# Patient Record
Sex: Female | Born: 1995 | Hispanic: No | Marital: Single | Smoking: Former smoker
Health system: Southern US, Community
[De-identification: ages and names within clinical notes are randomized; demographics above are authoritative.]

## PROBLEM LIST (undated history)

## (undated) ENCOUNTER — Inpatient Hospital Stay (HOSPITAL_COMMUNITY): Payer: Self-pay

## (undated) DIAGNOSIS — L309 Dermatitis, unspecified: Secondary | ICD-10-CM

## (undated) DIAGNOSIS — J45909 Unspecified asthma, uncomplicated: Secondary | ICD-10-CM

## (undated) DIAGNOSIS — F329 Major depressive disorder, single episode, unspecified: Secondary | ICD-10-CM

## (undated) DIAGNOSIS — F419 Anxiety disorder, unspecified: Secondary | ICD-10-CM

## (undated) DIAGNOSIS — J40 Bronchitis, not specified as acute or chronic: Secondary | ICD-10-CM

## (undated) DIAGNOSIS — F32A Depression, unspecified: Secondary | ICD-10-CM

## (undated) HISTORY — PX: WISDOM TOOTH EXTRACTION: SHX21

---
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
## 2017-03-07 ENCOUNTER — Ambulatory Visit (INDEPENDENT_AMBULATORY_CARE_PROVIDER_SITE_OTHER): Payer: Medicaid Other | Admitting: Physician Assistant

## 2017-03-07 ENCOUNTER — Encounter: Payer: Self-pay | Admitting: Physician Assistant

## 2017-03-07 VITALS — BP 112/79 | HR 116 | Temp 99.1°F | Wt 170.0 lb

## 2017-03-07 DIAGNOSIS — L2084 Intrinsic (allergic) eczema: Secondary | ICD-10-CM | POA: Diagnosis not present

## 2017-03-07 DIAGNOSIS — F418 Other specified anxiety disorders: Secondary | ICD-10-CM | POA: Insufficient documentation

## 2017-03-07 DIAGNOSIS — F39 Unspecified mood [affective] disorder: Secondary | ICD-10-CM

## 2017-03-07 DIAGNOSIS — K219 Gastro-esophageal reflux disease without esophagitis: Secondary | ICD-10-CM | POA: Diagnosis not present

## 2017-03-07 MED ORDER — RISPERIDONE 0.5 MG PO TABS: 0.5000 mg | ORAL_TABLET | Freq: Every day | ORAL | 1 refills | Status: DC

## 2017-03-07 MED ORDER — CITALOPRAM HYDROBROMIDE 20 MG PO TABS: 20.0000 mg | ORAL_TABLET | Freq: Every day | ORAL | 1 refills | Status: DC

## 2017-03-07 MED ORDER — OMEPRAZOLE 20 MG PO TBEC: 20.0000 mg | DELAYED_RELEASE_TABLET | Freq: Every day | ORAL | 2 refills | Status: DC

## 2017-03-07 MED ORDER — TRIAMCINOLONE ACETONIDE 0.1 % EX CREA: 1.0000 "application " | TOPICAL_CREAM | Freq: Two times a day (BID) | CUTANEOUS | 2 refills | Status: DC

## 2017-03-07 NOTE — Patient Instructions (Signed)
Bipolar 2 Disorder Bipolar 2 disorder is a mental health disorder in which a person has episodes of emotional highs (mania) and lows (depression). Bipolar 2 is different from other bipolar disorders because the manic episodes are not as high and do not last as long. This is called hypomania. People with bipolar 2 disorder usually go back and forth between hypomanic and depressive episodes. What are the causes? The cause of this condition is not known. What increases the risk? The following factors may make you more likely to develop this condition:  Having a family member with the disorder.  An imbalance of certain chemicals in the brain (neurotransmitters).  Stress, such as a death, illness, or financial problems.  Certain conditions that affect the brain or spinal cord (neurologic conditions).  Brain injury (trauma).  Having another mental health disorder, such as: ? Obsessive compulsive disorder. ? Schizophrenia.  What are the signs or symptoms? Symptoms of hypomania include:  Very high self-esteem or self-confidence.  Decreased need for sleep.  Unusual talkativeness or feeling a need to keep talking. Speech may be very fast. It may seem like you cannot stop talking.  Racing thoughts or constant talking, with quick shifts between topics that may or may not be related (flight of ideas).  Decreased ability to focus or concentrate.  Increased purposeful activity, such as work, studies, or social activity.  Increased nonproductive activity. This could be pacing, squirming and fidgeting, or finger and toe tapping.  Impulsive behavior and poor judgment. This may result in high-risk activities, such as having unprotected sex or spending a lot of money.  Symptoms of depression include:  Feeling sad, hopeless, or helpless.  Frequent or uncontrollable crying.  Lack of feeling or caring about anything.  Sleeping too much.  Moving more slowly than usual.  Not being able to  enjoy things you used to enjoy.  Desire to be alone all the time.  Feeling guilty or worthless.  Lack of energy or motivation.  Trouble concentrating or remembering.  Trouble making decisions.  Increased appetite.  Thoughts of death or desire to harm yourself.  How is this diagnosed? To diagnose bipolar 2 disorder, your health care provider may ask about your:  Emotional episodes.  Medical history.  Alcohol and drug use. This includes prescription medicines. Certain medical conditions and substances can cause symptoms that seem like bipolar disorder (secondary bipolar disorder).  How is this treated? Bipolar 2 disorder is a long-term (chronic) illness. It is best controlled with ongoing (continuous) treatment rather than being treated only when symptoms occur. Treatment may include:  Psychotherapy. Some forms of talk therapy, such as cognitive-behavioral therapy (CBT), can provide support, education, and guidance.  Coping strategies, such as journaling or relaxation exercises. Relaxation exercises include: ? Yoga. ? Meditation. ? Deep breathing.  Lifestyle changes, such as: ? Limiting alcohol and drug use. ? Exercising regularly. ? Getting plenty of sleep. ? Making healthy eating choices.  Medicine. Medicine can be prescribed by a health care provider who specializes in treating mental disorders (psychiatrist). ? Medicines called mood stabilizers are usually prescribed. ? If symptoms occur even while taking a mood stabilizer, other medicines may be added.  A combination of medicine, talk therapy, and coping methods is the best way to treat this condition. Follow these instructions at home: Activity  Return to your normal activities as told by your health care provider.  Find activities that you enjoy, and make time to do them.  Exercise regularly as told by your health   care provider. Lifestyle  Limit alcohol intake to no more than 1 drink a day for nonpregnant  women and 2 drinks a day for men. One drink equals 12 oz of beer, 5 oz of wine, or 1 oz of hard liquor.  Follow a set schedule for eating and sleeping.  Eat a balanced diet that includes fresh fruits and vegetables, whole grains, low-fat dairy, and lean meats.  Get at least 7-8 hours of sleep each night. General instructions  Take over-the-counter and prescription medicines only as told by your health care provider.  Think about joining a support group. Your health care provider may be able to recommend a support group.  Talk with your family and loved ones about your treatment goals and how they can help.  Keep all follow-up visits as told by your health care provider. This is important. Where to find more information: For more information about bipolar 2 disorder, visit the following websites:  National Alliance on Mental Illness: www.nami.org  U.S. National Institute of Mental Health: www.nimh.nih.gov  Contact a health care provider if:  Your symptoms get worse.  You have side effects from your medicine, and they get worse.  You have trouble sleeping.  You have trouble doing daily activities.  You feel unsafe in your surroundings.  You are dealing with substance abuse. Get help right away if:  You have new symptoms.  You have thoughts about harming yourself or others.  You harm yourself. Summary  Bipolar 2 disorder is a mental health disorder in which a person has episodes of hypomania and depression.  Bipolar 2 is best treated through a combination of medicines, talk therapy, and coping strategies.  Talk with your family and loved ones about your treatment goals and how they can help. This information is not intended to replace advice given to you by your health care provider. Make sure you discuss any questions you have with your health care provider. Document Released: 08/31/2016 Document Revised: 08/31/2016 Document Reviewed: 08/31/2016 Elsevier Interactive  Patient Education  2018 Elsevier Inc.  

## 2017-03-07 NOTE — Progress Notes (Signed)
BP 112/79   Pulse (!) 116   Temp 99.1 F (37.3 C) (Oral)   Wt 170 lb (77.1 kg)   LMP 01/28/2017    Subjective:    Patient ID: Tiffany [name], female    DOB: [birth year], 21 y.o.   MRN: [id number]  HPI: Tiffany [name] is a 21 y.o. female presenting on 03/07/2017 for Nausea; Emesis; Heartburn; and Anxiety  This is a new patient comes in to be established today. She was [facility] by me. She has not been seen in some time. She reports that she had been to [facility] but several than it made her sick. She discontinued services there. She reports severe GERD reflux symptoms. She is also having significant depression and mood swings. She had been diagnosed with bipolar disorder. She has also had long-term problems with GERD and eczema. Depression screen PHQ 2/9 03/07/2017  Decreased Interest 3  Down, Depressed, Hopeless 3  PHQ - 2 Score 6  Altered sleeping 3  Tired, decreased energy 3  Change in appetite 0  Feeling bad or failure about yourself  3  Trouble concentrating 1  Moving slowly or fidgety/restless 3  Suicidal thoughts 0  PHQ-9 Score 19    Relevant past medical, surgical, family and social history reviewed and updated as indicated. Allergies and medications reviewed and updated.  Past Medical History:  Diagnosis Date  . Anxiety   . Asthma   . Bronchitis in child   . Depression   . Eczema     Past Surgical History:  Procedure Laterality Date  . WISDOM TOOTH EXTRACTION      Review of Systems  Constitutional: Negative.   HENT: Negative.   Eyes: Negative.   Respiratory: Negative.   Gastrointestinal: Positive for abdominal pain and nausea.  Genitourinary: Negative.   Skin: Positive for rash.  Psychiatric/Behavioral: Positive for agitation, decreased concentration, dysphoric mood and sleep disturbance. The patient is nervous/anxious.     Allergies as of 03/07/2017   No Known Allergies     Medication  List       Accurate as of 03/07/17 11:59 PM. Always use your most recent med list.          albuterol 108 (90 Base) MCG/ACT inhaler Commonly known as:  PROVENTIL HFA;VENTOLIN HFA Inhale 2 puffs into the lungs every 4 (four) hours as needed.   citalopram 20 MG tablet Commonly known as:  CELEXA Take 1 tablet (20 mg total) by mouth daily.   hydrOXYzine 25 MG tablet Commonly known as:  ATARAX/VISTARIL Take 1 tablet (25 mg total) by mouth every 6 (six) hours as needed for itching.   Omeprazole 20 MG Tbec Take 1 tablet (20 mg total) by mouth daily.   risperiDONE 0.5 MG tablet Commonly known as:  RISPERDAL Take 1 tablet (0.5 mg total) by mouth at bedtime.   triamcinolone ointment 0.1 % Commonly known as:  KENALOG Apply to soaking wet skin on legs BID   triamcinolone cream 0.1 % Commonly known as:  KENALOG Apply 1 application topically 2 (two) times daily.          Objective:    BP 112/79   Pulse (!) 116   Temp 99.1 F (37.3 C) (Oral)   Wt 170 lb (77.1 kg)   LMP 01/28/2017   No Known Allergies  Physical Exam  Constitutional: She is oriented to person, place, and time. She appears well-developed and well-nourished.  HENT:  Head: Normocephalic and atraumatic.  Right Ear: Tympanic membrane, external ear and ear canal normal.  Left Ear: Tympanic membrane, external ear and ear canal normal.  Nose: Nose normal. No rhinorrhea.  Mouth/Throat: Oropharynx is clear and moist and mucous membranes are normal. No oropharyngeal exudate or posterior oropharyngeal erythema.  Eyes: Pupils are equal, round, and reactive to light. Conjunctivae and EOM are normal.  Neck: Normal range of motion. Neck supple.  Cardiovascular: Normal rate, regular rhythm, normal heart sounds and intact distal pulses.   Pulmonary/Chest: Effort normal and breath sounds normal.  Abdominal: Soft. Bowel sounds are normal.  Neurological: She is alert and oriented to person, place, and time. She has normal  reflexes.  Skin: Skin is warm and dry. No rash noted.  Throughout trunk arms and upper legs flat erythematous patches with irritation  Psychiatric: She has a normal mood and affect. Her behavior is normal. Judgment and thought content normal.    Results for orders placed or performed during the hospital encounter of 10/11/16  Rapid strep screen  Result Value Ref Range   Streptococcus, Group A Screen (Direct) NEGATIVE NEGATIVE  Culture, group A strep  Result Value Ref Range   Specimen Description THROAT    Special Requests NONE    Culture      NO GROUP A STREP (S.PYOGENES) ISOLATED Performed at [facility], [address] [place], [place] [address]    Report Status 10/13/2016 FINAL       Assessment & Plan:   1. Gastroesophageal reflux disease without esophagitis - Omeprazole 20 MG TBEC; Take 1 tablet (20 mg total) by mouth daily.  Dispense: 30 each; Refill: 2  2. Mood disorder (HCC) - citalopram (CELEXA) 20 MG tablet; Take 1 tablet (20 mg total) by mouth daily.  Dispense: 30 tablet; Refill: 1 - risperiDONE (RISPERDAL) 0.5 MG tablet; Take 1 tablet (0.5 mg total) by mouth at bedtime.  Dispense: 30 tablet; Refill: 1  3. Intrinsic eczema - triamcinolone cream (KENALOG) 0.1 %; Apply 1 application topically 2 (two) times daily.  Dispense: 454 g; Refill: 2   Current Outpatient Prescriptions:  .  albuterol (PROVENTIL HFA;VENTOLIN HFA) 108 (90 Base) MCG/ACT inhaler, Inhale 2 puffs into the lungs every 4 (four) hours as needed., Disp: 6.7 g, Rfl: 0 .  hydrOXYzine (ATARAX/VISTARIL) 25 MG tablet, Take 1 tablet (25 mg total) by mouth every 6 (six) hours as needed for itching., Disp: 30 tablet, Rfl: 0 .  triamcinolone ointment (KENALOG) 0.1 %, Apply to soaking wet skin on legs BID, Disp: , Rfl:  .  citalopram (CELEXA) 20 MG tablet, Take 1 tablet (20 mg total) by mouth daily., Disp: 30 tablet, Rfl: 1 .  Omeprazole 20 MG TBEC, Take 1 tablet (20 mg total) by mouth daily., Disp: 30 each,  Rfl: 2 .  risperiDONE (RISPERDAL) 0.5 MG tablet, Take 1 tablet (0.5 mg total) by mouth at bedtime., Disp: 30 tablet, Rfl: 1 .  triamcinolone cream (KENALOG) 0.1 %, Apply 1 application topically 2 (two) times daily., Disp: 454 g, Rfl: 2   Continue all other maintenance medications as listed above.  Follow up plan: Return in about 4 weeks (around 04/04/2017) for recheck.  Educational handout given for survey  Remus LofflerAngel S. Tarun Patchell PA-C [facility] [address]  [place], [place] [address] [phone]   03/08/2017, 9:16 AM

## 2017-03-14 ENCOUNTER — Encounter (HOSPITAL_COMMUNITY): Payer: Self-pay | Admitting: *Deleted

## 2017-03-14 ENCOUNTER — Emergency Department (HOSPITAL_COMMUNITY)
Admission: EM | Admit: 2017-03-14 | Discharge: 2017-03-15 | Disposition: A | Discharge status: Home / Self Care | Payer: Medicaid Other | Attending: Emergency Medicine | Admitting: Emergency Medicine

## 2017-03-14 DIAGNOSIS — Z87891 Personal history of nicotine dependence: Secondary | ICD-10-CM | POA: Diagnosis not present

## 2017-03-14 DIAGNOSIS — O219 Vomiting of pregnancy, unspecified: Secondary | ICD-10-CM | POA: Insufficient documentation

## 2017-03-14 DIAGNOSIS — J45909 Unspecified asthma, uncomplicated: Secondary | ICD-10-CM | POA: Insufficient documentation

## 2017-03-14 DIAGNOSIS — Z3A01 Less than 8 weeks gestation of pregnancy: Secondary | ICD-10-CM | POA: Diagnosis not present

## 2017-03-14 DIAGNOSIS — Z79899 Other long term (current) drug therapy: Secondary | ICD-10-CM | POA: Diagnosis not present

## 2017-03-14 DIAGNOSIS — R112 Nausea with vomiting, unspecified: Secondary | ICD-10-CM | POA: Diagnosis present

## 2017-03-14 LAB — POC URINE PREG, ED: Preg Test, Ur: POSITIVE — AB

## 2017-03-14 NOTE — ED Triage Notes (Signed)
Pt c/o missed period for 11 days; pt states she took a home pregnancy test and it was positive

## 2017-03-15 MED ORDER — METOCLOPRAMIDE HCL 10 MG PO TABS: 10.0000 mg | ORAL_TABLET | Freq: Four times a day (QID) | ORAL | 0 refills | Status: DC | PRN

## 2017-03-15 MED ORDER — PRENATAL COMPLETE 14-0.4 MG PO TABS: 1.0000 | ORAL_TABLET | Freq: Every day | ORAL | 6 refills | Status: DC

## 2017-03-15 NOTE — ED Provider Notes (Signed)
[facility] DEPT Provider Note   CSN: [id number] Arrival date & time: 03/14/17  2331     History   Chief Complaint Chief Complaint  Patient presents with  . Possible Pregnancy    HPI Tiffany [name] is a 21 y.o. female.  Patient presents to the ER with concerns over pregnancy. Patient reports that she is 11 days overdue for her menstrual period. She has been experiencing nausea and vomiting in the mornings. She has noticed occasional very slight pain around the umbilicus when she lies down at night, but no continuous pain. No vaginal bleeding. She is concerned because she has had 2 previous miscarriages.      Past Medical History:  Diagnosis Date  . Anxiety   . Asthma   . Bronchitis in child   . Depression   . Eczema     Patient Active Problem List   Diagnosis Date Noted  . Gastroesophageal reflux disease without esophagitis 03/07/2017  . Mood disorder (HCC) 03/07/2017  . Intrinsic eczema 03/07/2017    Past Surgical History:  Procedure Laterality Date  . WISDOM TOOTH EXTRACTION      OB History    No data available       Home Medications    Prior to Admission medications   Medication Sig Start Date End Date Taking? Authorizing Provider  albuterol (PROVENTIL HFA;VENTOLIN HFA) 108 (90 Base) MCG/ACT inhaler Inhale 2 puffs into the lungs every 4 (four) hours as needed. 10/11/16   Devoria AlbeKnapp, Iva, MD  citalopram (CELEXA) 20 MG tablet Take 1 tablet (20 mg total) by mouth daily. 03/07/17   Remus LofflerJones, Angel S, PA-C  hydrOXYzine (ATARAX/VISTARIL) 25 MG tablet Take 1 tablet (25 mg total) by mouth every 6 (six) hours as needed for itching. 05/21/12   Marcellina MillinGaley, Timothy, MD  metoCLOPramide (REGLAN) 10 MG tablet Take 1 tablet (10 mg total) by mouth every 6 (six) hours as needed for nausea (nausea/headache). 03/15/17   Gilda CreasePollina, Christopher J, MD  Omeprazole 20 MG TBEC Take 1 tablet (20 mg total) by mouth daily. 03/07/17   Remus LofflerJones, Angel S, PA-C  Prenatal Vit-Fe Fumarate-FA (PRENATAL  COMPLETE) 14-0.4 MG TABS Take 1 tablet by mouth daily. 03/15/17   Gilda CreasePollina, Christopher J, MD  risperiDONE (RISPERDAL) 0.5 MG tablet Take 1 tablet (0.5 mg total) by mouth at bedtime. 03/07/17   Remus LofflerJones, Angel S, PA-C  triamcinolone cream (KENALOG) 0.1 % Apply 1 application topically 2 (two) times daily. 03/07/17   Remus LofflerJones, Angel S, PA-C  triamcinolone ointment (KENALOG) 0.1 % Apply to soaking wet skin on legs BID 12/12/12   [provider]    Family History Family History  Problem Relation Age of Onset  . COPD Mother     Social History Social History  Substance Use Topics  . Smoking status: Former Games developermoker  . Smokeless tobacco: Never Used  . Alcohol use No     Allergies   Patient has no known allergies.   Review of Systems Review of Systems  Gastrointestinal: Positive for nausea and vomiting.  All other systems reviewed and are negative.    Physical Exam Updated Vital Signs BP 118/77 (BP Location: Right Arm)   Pulse (!) 129   Temp 98.4 F (36.9 C) (Oral)   Resp 18   Ht 5' 2.5" (1.588 m)   Wt 64.6 kg (142 lb 8 oz)   LMP 02/27/2017   SpO2 98%   BMI 25.65 kg/m   Physical Exam  Constitutional: She is oriented to person, place, and time.  She appears well-developed and well-nourished. No distress.  HENT:  Head: Normocephalic and atraumatic.  Right Ear: Hearing normal.  Left Ear: Hearing normal.  Nose: Nose normal.  Mouth/Throat: Oropharynx is clear and moist and mucous membranes are normal.  Eyes: Pupils are equal, round, and reactive to light. Conjunctivae and EOM are normal.  Neck: Normal range of motion. Neck supple.  Cardiovascular: Regular rhythm, S1 normal and S2 normal.  Exam reveals no gallop and no friction rub.   No murmur heard. Pulmonary/Chest: Effort normal and breath sounds normal. No respiratory distress. She exhibits no tenderness.  Abdominal: Soft. Normal appearance and bowel sounds are normal. There is no hepatosplenomegaly. There is no tenderness.  There is no rebound, no guarding, no tenderness at McBurney's point and negative Murphy's sign. No hernia.  Musculoskeletal: Normal range of motion.  Neurological: She is alert and oriented to person, place, and time. She has normal strength. No cranial nerve deficit or sensory deficit. Coordination normal. GCS eye subscore is 4. GCS verbal subscore is 5. GCS motor subscore is 6.  Skin: Skin is warm, dry and intact. No rash noted. No cyanosis.  Psychiatric: She has a normal mood and affect. Her speech is normal and behavior is normal. Thought content normal.  Nursing note and vitals reviewed.    ED Treatments / Results  Labs (all labs ordered are listed, but only abnormal results are displayed) Labs Reviewed  POC URINE PREG, ED - Abnormal; Notable for the following:       Result Value   Preg Test, Ur POSITIVE (*)    All other components within normal limits    EKG  EKG Interpretation None       Radiology No results found.  Procedures Procedures (including critical care time)  Medications Ordered in ED Medications - No data to display   Initial Impression / Assessment and Plan / ED Course  I have reviewed the triage vital signs and the nursing notes.  Pertinent labs & imaging results that were available during my care of the patient were reviewed by me and considered in my medical decision making (see chart for details).     Patient presents with concern over pregnancy. She is nervous because she has had 2 previous miscarriages. She is experiencing morning sickness, but no other symptoms. There is no abdominal or pelvic pain. Her abdominal exam is benign. She has not had any vaginal bleeding. She does not require any further care at this time, will treat with Reglan for nausea and vomiting, [facility] at [facility].  Final Clinical Impressions(s) / ED Diagnoses   Final diagnoses:  Less than [redacted] weeks gestation of pregnancy    New Prescriptions New  Prescriptions   METOCLOPRAMIDE (REGLAN) 10 MG TABLET    Take 1 tablet (10 mg total) by mouth every 6 (six) hours as needed for nausea (nausea/headache).   PRENATAL VIT-FE FUMARATE-FA (PRENATAL COMPLETE) 14-0.4 MG TABS    Take 1 tablet by mouth daily.     Gilda Crease, MD 03/15/17 [id number]

## 2017-03-28 ENCOUNTER — Other Ambulatory Visit: Payer: Self-pay | Admitting: Physician Assistant

## 2017-03-28 ENCOUNTER — Encounter: Payer: Self-pay | Admitting: Adult Health

## 2017-03-28 ENCOUNTER — Telehealth: Payer: Self-pay | Admitting: Adult Health

## 2017-03-28 ENCOUNTER — Ambulatory Visit (INDEPENDENT_AMBULATORY_CARE_PROVIDER_SITE_OTHER): Payer: Medicaid Other | Admitting: Adult Health

## 2017-03-28 VITALS — BP 110/60 | HR 88 | Ht 62.5 in | Wt 164.0 lb

## 2017-03-28 DIAGNOSIS — F329 Major depressive disorder, single episode, unspecified: Secondary | ICD-10-CM

## 2017-03-28 DIAGNOSIS — N926 Irregular menstruation, unspecified: Secondary | ICD-10-CM | POA: Diagnosis not present

## 2017-03-28 DIAGNOSIS — F32A Depression, unspecified: Secondary | ICD-10-CM

## 2017-03-28 DIAGNOSIS — Z3201 Encounter for pregnancy test, result positive: Secondary | ICD-10-CM | POA: Diagnosis not present

## 2017-03-28 DIAGNOSIS — O3680X0 Pregnancy with inconclusive fetal viability, not applicable or unspecified: Secondary | ICD-10-CM

## 2017-03-28 DIAGNOSIS — Z3A01 Less than 8 weeks gestation of pregnancy: Secondary | ICD-10-CM | POA: Insufficient documentation

## 2017-03-28 DIAGNOSIS — O219 Vomiting of pregnancy, unspecified: Secondary | ICD-10-CM | POA: Insufficient documentation

## 2017-03-28 DIAGNOSIS — F39 Unspecified mood [affective] disorder: Secondary | ICD-10-CM

## 2017-03-28 LAB — POCT URINE PREGNANCY: PREG TEST UR: POSITIVE — AB

## 2017-03-28 MED ORDER — ESCITALOPRAM OXALATE 10 MG PO TABS: 10.0000 mg | ORAL_TABLET | Freq: Every day | ORAL | 6 refills | Status: DC

## 2017-03-28 MED ORDER — DOXYLAMINE-PYRIDOXINE 10-10 MG PO TBEC: DELAYED_RELEASE_TABLET | ORAL | 0 refills | Status: DC

## 2017-03-28 MED ORDER — FLINTSTONES COMPLETE 60 MG PO CHEW: CHEWABLE_TABLET | ORAL | Status: DC

## 2017-03-28 NOTE — Patient Instructions (Signed)
First Trimester of Pregnancy The first trimester of pregnancy is from week 1 until the end of week 13 (months 1 through 3). A week after a sperm fertilizes an egg, the egg will implant on the wall of the uterus. This embryo will begin to develop into a baby. Genes from you and your partner will form the baby. The female genes will determine whether the baby will be a boy or a girl. At 6-8 weeks, the eyes and face will be formed, and the heartbeat can be seen on ultrasound. At the end of 12 weeks, all the baby's organs will be formed. Now that you are pregnant, you will want to do everything you can to have a healthy baby. Two of the most important things are to get good prenatal care and to follow your health care provider's instructions. Prenatal care is all the medical care you receive before the baby's birth. This care will help prevent, find, and treat any problems during the pregnancy and childbirth. Body changes during your first trimester Your body goes through many changes during pregnancy. The changes vary from woman to woman.  You may gain or lose a couple of pounds at first.  You may feel sick to your stomach (nauseous) and you may throw up (vomit). If the vomiting is uncontrollable, call your health care provider.  You may tire easily.  You may develop headaches that can be relieved by medicines. All medicines should be approved by your health care provider.  You may urinate more often. Painful urination may mean you have a bladder infection.  You may develop heartburn as a result of your pregnancy.  You may develop constipation because certain hormones are causing the muscles that push stool through your intestines to slow down.  You may develop hemorrhoids or swollen veins (varicose veins).  Your breasts may begin to grow larger and become tender. Your nipples may stick out more, and the tissue that surrounds them (areola) may become darker.  Your gums may bleed and may be  sensitive to brushing and flossing.  Dark spots or blotches (chloasma, mask of pregnancy) may develop on your face. This will likely fade after the baby is born.  Your menstrual periods will stop.  You may have a loss of appetite.  You may develop cravings for certain kinds of food.  You may have changes in your emotions from day to day, such as being excited to be pregnant or being concerned that something may go wrong with the pregnancy and baby.  You may have more vivid and strange dreams.  You may have changes in your hair. These can include thickening of your hair, rapid growth, and changes in texture. Some women also have hair loss during or after pregnancy, or hair that feels dry or thin. Your hair will most likely return to normal after your baby is born.  What to expect at prenatal visits During a routine prenatal visit:  You will be weighed to make sure you and the baby are growing normally.  Your blood pressure will be taken.  Your abdomen will be measured to track your baby's growth.  The fetal heartbeat will be listened to between weeks 10 and 14 of your pregnancy.  Test results from any previous visits will be discussed.  Your health care provider may ask you:  How you are feeling.  If you are feeling the baby move.  If you have had any abnormal symptoms, such as leaking fluid, bleeding, severe headaches,   or abdominal cramping.  If you are using any tobacco products, including cigarettes, chewing tobacco, and electronic cigarettes.  If you have any questions.  Other tests that may be performed during your first trimester include:  Blood tests to find your blood type and to check for the presence of any previous infections. The tests will also be used to check for low iron levels (anemia) and protein on red blood cells (Rh antibodies). Depending on your risk factors, or if you previously had diabetes during pregnancy, you may have tests to check for high blood  sugar that affects pregnant women (gestational diabetes).  Urine tests to check for infections, diabetes, or protein in the urine.  An ultrasound to confirm the proper growth and development of the baby.  Fetal screens for spinal cord problems (spina bifida) and Down syndrome.  HIV (human immunodeficiency virus) testing. Routine prenatal testing includes screening for HIV, unless you choose not to have this test.  You may need other tests to make sure you and the baby are doing well.  Follow these instructions at home: Medicines  Follow your health care provider's instructions regarding medicine use. Specific medicines may be either safe or unsafe to take during pregnancy.  Take a prenatal vitamin that contains at least 600 micrograms (mcg) of folic acid.  If you develop constipation, try taking a stool softener if your health care provider approves. Eating and drinking  Eat a balanced diet that includes fresh fruits and vegetables, whole grains, good sources of protein such as meat, eggs, or tofu, and low-fat dairy. Your health care provider will help you determine the amount of weight gain that is right for you.  Avoid raw meat and uncooked cheese. These carry germs that can cause birth defects in the baby.  Eating four or five small meals rather than three large meals a day may help relieve nausea and vomiting. If you start to feel nauseous, eating a few soda crackers can be helpful. Drinking liquids between meals, instead of during meals, also seems to help ease nausea and vomiting.  Limit foods that are high in fat and processed sugars, such as fried and sweet foods.  To prevent constipation: ? Eat foods that are high in fiber, such as fresh fruits and vegetables, whole grains, and beans. ? Drink enough fluid to keep your urine clear or pale yellow. Activity  Exercise only as directed by your health care provider. Most women can continue their usual exercise routine during  pregnancy. Try to exercise for 30 minutes at least 5 days a week. Exercising will help you: ? Control your weight. ? Stay in shape. ? Be prepared for labor and delivery.  Experiencing pain or cramping in the lower abdomen or lower back is a good sign that you should stop exercising. Check with your health care provider before continuing with normal exercises.  Try to avoid standing for long periods of time. Move your legs often if you must stand in one place for a long time.  Avoid heavy lifting.  Wear low-heeled shoes and practice good posture.  You may continue to have sex unless your health care provider tells you not to. Relieving pain and discomfort  Wear a good support bra to relieve breast tenderness.  Take warm sitz baths to soothe any pain or discomfort caused by hemorrhoids. Use hemorrhoid cream if your health care provider approves.  Rest with your legs elevated if you have leg cramps or low back pain.  If you develop   varicose veins in your legs, wear support hose. Elevate your feet for 15 minutes, 3-4 times a day. Limit salt in your diet. Prenatal care  Schedule your prenatal visits by the twelfth week of pregnancy. They are usually scheduled monthly at first, then more often in the last 2 months before delivery.  Write down your questions. Take them to your prenatal visits.  Keep all your prenatal visits as told by your health care provider. This is important. Safety  Wear your seat belt at all times when driving.  Make a list of emergency phone numbers, including numbers for family, friends, the hospital, and police and fire departments. General instructions  Ask your health care provider for a referral to a local prenatal education class. Begin classes no later than the beginning of month 6 of your pregnancy.  Ask for help if you have counseling or nutritional needs during pregnancy. Your health care provider can offer advice or refer you to specialists for help  with various needs.  Do not use hot tubs, steam rooms, or saunas.  Do not douche or use tampons or scented sanitary pads.  Do not cross your legs for long periods of time.  Avoid cat litter boxes and soil used by cats. These carry germs that can cause birth defects in the baby and possibly loss of the fetus by miscarriage or stillbirth.  Avoid all smoking, herbs, alcohol, and medicines not prescribed by your health care provider. Chemicals in these products affect the formation and growth of the baby.  Do not use any products that contain nicotine or tobacco, such as cigarettes and e-cigarettes. If you need help quitting, ask your health care provider. You may receive counseling support and other resources to help you quit.  Schedule a dentist appointment. At home, brush your teeth with a soft toothbrush and be gentle when you floss. Contact a health care provider if:  You have dizziness.  You have mild pelvic cramps, pelvic pressure, or nagging pain in the abdominal area.  You have persistent nausea, vomiting, or diarrhea.  You have a bad smelling vaginal discharge.  You have pain when you urinate.  You notice increased swelling in your face, hands, legs, or ankles.  You are exposed to fifth disease or chickenpox.  You are exposed to German measles (rubella) and have never had it. Get help right away if:  You have a fever.  You are leaking fluid from your vagina.  You have spotting or bleeding from your vagina.  You have severe abdominal cramping or pain.  You have rapid weight gain or loss.  You vomit blood or material that looks like coffee grounds.  You develop a severe headache.  You have shortness of breath.  You have any kind of trauma, such as from a fall or a car accident. Summary  The first trimester of pregnancy is from week 1 until the end of week 13 (months 1 through 3).  Your body goes through many changes during pregnancy. The changes vary from  woman to woman.  You will have routine prenatal visits. During those visits, your health care provider will examine you, discuss any test results you may have, and talk with you about how you are feeling. This information is not intended to replace advice given to you by your health care provider. Make sure you discuss any questions you have with your health care provider. Document Released: 07/20/2001 Document Revised: 07/07/2016 Document Reviewed: 07/07/2016 Elsevier Interactive Patient Education  2017 [employer]  

## 2017-03-28 NOTE — Progress Notes (Signed)
Subjective:     Patient ID: Tiffany [name], female   DOB: [birth year], 21 y.o.   MRN: [id number]  HPI Tiffany [name] is a 21 year old white female in for UPT. Has nausea and vomiting, has reglan, not helping.   Review of Systems +missed period with +PT +Nausea and vomiting +heartburn Reviewed past medical,surgical, social and family history. Reviewed medications and allergies.     Objective:   Physical Exam BP 110/60 (BP Location: Left Arm, Patient Position: Sitting, Cuff Size: Normal)   Pulse 88   Ht 5' 2.5" (1.588 m)   Wt 164 lb (74.4 kg)   LMP 02/25/2017 (Approximate)   BMI 29.52 kg/m UPT +, about 4+3 weeks by LMP with EDD 12/02/17.Skin warm and dry. Neck: mid line trachea, normal thyroid, good ROM, no lymphadenopathy noted. Lungs: clear to ausculation bilaterally. Cardiovascular: regular rate and rhythm. Abdomen is soft and non tender PHQ 9 score 22, denies being suicidal, but has been depressed for years, is open to meds, will rx lexapro.    Assessment:     1. Pregnancy examination or test, positive result   2. Less than [redacted] weeks gestation of pregnancy   3. Nausea and vomiting during pregnancy prior to [redacted] weeks gestation   4. Depression, unspecified depression type   5. Encounter to determine fetal viability of pregnancy, single or unspecified fetus       Plan:     Take [place] complete 2 daily Given 48 diclegis to try take as directed Eat often Rx lexapro 10 mg take 1 daily #30 with 6 refills Return in 2 weeks for dating [place] Review handout on first trimester and by [facility]

## 2017-03-28 NOTE — Telephone Encounter (Signed)
Spoke with pt's mom. Pt was wondering if Tylenol #3 was safe to take during pregnancy. I spoke with JAG and she said yes. Pt's mom also asked about Omeprazole. JAG advised it was ok if she has to take it or she can take OTC Tums. Pt's mom also asked about Risperidone. JAG advised not to take that but to take Lexapro. That prescription was sent to her pharmacy. Pt's mom voiced understanding. JSY

## 2017-04-12 ENCOUNTER — Ambulatory Visit (INDEPENDENT_AMBULATORY_CARE_PROVIDER_SITE_OTHER): Payer: Medicaid Other

## 2017-04-12 DIAGNOSIS — O3680X0 Pregnancy with inconclusive fetal viability, not applicable or unspecified: Secondary | ICD-10-CM | POA: Diagnosis not present

## 2017-04-12 NOTE — Progress Notes (Signed)
US 8+3 wks,single IUP w/ys,positive FHT 169 bpm,normal ovaries bilat,crl 19.15 mm.EDD 11/19/2017

## 2017-04-21 ENCOUNTER — Encounter: Payer: Medicaid Other | Admitting: Advanced Practice Midwife

## 2017-05-03 ENCOUNTER — Encounter: Payer: Medicaid Other | Admitting: Advanced Practice Midwife

## 2017-05-03 ENCOUNTER — Ambulatory Visit: Payer: Medicaid Other | Admitting: *Deleted

## 2017-05-11 ENCOUNTER — Ambulatory Visit: Payer: Medicaid Other | Admitting: *Deleted

## 2017-05-11 ENCOUNTER — Ambulatory Visit (INDEPENDENT_AMBULATORY_CARE_PROVIDER_SITE_OTHER): Payer: Medicaid Other | Admitting: Advanced Practice Midwife

## 2017-05-11 ENCOUNTER — Encounter: Payer: Self-pay | Admitting: Advanced Practice Midwife

## 2017-05-11 VITALS — BP 110/70 | HR 70 | Wt 159.0 lb

## 2017-05-11 DIAGNOSIS — Z3401 Encounter for supervision of normal first pregnancy, first trimester: Secondary | ICD-10-CM

## 2017-05-11 DIAGNOSIS — F1290 Cannabis use, unspecified, uncomplicated: Secondary | ICD-10-CM | POA: Insufficient documentation

## 2017-05-11 DIAGNOSIS — Z3400 Encounter for supervision of normal first pregnancy, unspecified trimester: Secondary | ICD-10-CM | POA: Insufficient documentation

## 2017-05-11 DIAGNOSIS — O99321 Drug use complicating pregnancy, first trimester: Secondary | ICD-10-CM | POA: Diagnosis not present

## 2017-05-11 DIAGNOSIS — Z3A12 12 weeks gestation of pregnancy: Secondary | ICD-10-CM

## 2017-05-11 DIAGNOSIS — Z3682 Encounter for antenatal screening for nuchal translucency: Secondary | ICD-10-CM

## 2017-05-11 DIAGNOSIS — Z1389 Encounter for screening for other disorder: Secondary | ICD-10-CM

## 2017-05-11 DIAGNOSIS — Z331 Pregnant state, incidental: Secondary | ICD-10-CM

## 2017-05-11 LAB — POCT URINALYSIS DIPSTICK
Glucose, UA: NEGATIVE
Leukocytes, UA: NEGATIVE
NITRITE UA: NEGATIVE
RBC UA: NEGATIVE

## 2017-05-11 MED ORDER — DOXYLAMINE-PYRIDOXINE 10-10 MG PO TBEC: DELAYED_RELEASE_TABLET | ORAL | 3 refills | Status: DC

## 2017-05-11 NOTE — Patient Instructions (Signed)
 First Trimester of Pregnancy The first trimester of pregnancy is from week 1 until the end of week 12 (months 1 through 3). A week after a sperm fertilizes an egg, the egg will implant on the wall of the uterus. This embryo will begin to develop into a baby. Genes from you and your partner are forming the baby. The female genes determine whether the baby is a boy or a girl. At 6-8 weeks, the eyes and face are formed, and the heartbeat can be seen on ultrasound. At the end of 12 weeks, all the baby's organs are formed.  Now that you are pregnant, you will want to do everything you can to have a healthy baby. Two of the most important things are to get good prenatal care and to follow your health care provider's instructions. Prenatal care is all the medical care you receive before the baby's birth. This care will help prevent, find, and treat any problems during the pregnancy and childbirth. BODY CHANGES Your body goes through many changes during pregnancy. The changes vary from woman to woman.   You may gain or lose a couple of pounds at first.  You may feel sick to your stomach (nauseous) and throw up (vomit). If the vomiting is uncontrollable, call your health care provider.  You may tire easily.  You may develop headaches that can be relieved by medicines approved by your health care provider.  You may urinate more often. Painful urination may mean you have a bladder infection.  You may develop heartburn as a result of your pregnancy.  You may develop constipation because certain hormones are causing the muscles that push waste through your intestines to slow down.  You may develop hemorrhoids or swollen, bulging veins (varicose veins).  Your breasts may begin to grow larger and become tender. Your nipples may stick out more, and the tissue that surrounds them (areola) may become darker.  Your gums may bleed and may be sensitive to brushing and flossing.  Dark spots or blotches  (chloasma, mask of pregnancy) may develop on your face. This will likely fade after the baby is born.  Your menstrual periods will stop.  You may have a loss of appetite.  You may develop cravings for certain kinds of food.  You may have changes in your emotions from day to day, such as being excited to be pregnant or being concerned that something may go wrong with the pregnancy and baby.  You may have more vivid and strange dreams.  You may have changes in your hair. These can include thickening of your hair, rapid growth, and changes in texture. Some women also have hair loss during or after pregnancy, or hair that feels dry or thin. Your hair will most likely return to normal after your baby is born. WHAT TO EXPECT AT YOUR PRENATAL VISITS During a routine prenatal visit:  You will be weighed to make sure you and the baby are growing normally.  Your blood pressure will be taken.  Your abdomen will be measured to track your baby's growth.  The fetal heartbeat will be listened to starting around week 10 or 12 of your pregnancy.  Test results from any previous visits will be discussed. Your health care provider may ask you:  How you are feeling.  If you are feeling the baby move.  If you have had any abnormal symptoms, such as leaking fluid, bleeding, severe headaches, or abdominal cramping.  If you have any questions. Other   tests that may be performed during your first trimester include:  Blood tests to find your blood type and to check for the presence of any previous infections. They will also be used to check for low iron levels (anemia) and Rh antibodies. Later in the pregnancy, blood tests for diabetes will be done along with other tests if problems develop.  Urine tests to check for infections, diabetes, or protein in the urine.  An ultrasound to confirm the proper growth and development of the baby.  An amniocentesis to check for possible genetic problems.  Fetal  screens for spina bifida and Down syndrome.  You may need other tests to make sure you and the baby are doing well. HOME CARE INSTRUCTIONS  Medicines  Follow your health care provider's instructions regarding medicine use. Specific medicines may be either safe or unsafe to take during pregnancy.  Take your prenatal vitamins as directed.  If you develop constipation, try taking a stool softener if your health care provider approves. Diet  Eat regular, well-balanced meals. Choose a variety of foods, such as meat or vegetable-based protein, fish, milk and low-fat dairy products, vegetables, fruits, and whole grain breads and cereals. Your health care provider will help you determine the amount of weight gain that is right for you.  Avoid raw meat and uncooked cheese. These carry germs that can cause birth defects in the baby.  Eating four or five small meals rather than three large meals a day may help relieve nausea and vomiting. If you start to feel nauseous, eating a few soda crackers can be helpful. Drinking liquids between meals instead of during meals also seems to help nausea and vomiting.  If you develop constipation, eat more high-fiber foods, such as fresh vegetables or fruit and whole grains. Drink enough fluids to keep your urine clear or pale yellow. Activity and Exercise  Exercise only as directed by your health care provider. Exercising will help you:  Control your weight.  Stay in shape.  Be prepared for labor and delivery.  Experiencing pain or cramping in the lower abdomen or low back is a good sign that you should stop exercising. Check with your health care provider before continuing normal exercises.  Try to avoid standing for long periods of time. Move your legs often if you must stand in one place for a long time.  Avoid heavy lifting.  Wear low-heeled shoes, and practice good posture.  You may continue to have sex unless your health care provider directs you  otherwise. Relief of Pain or Discomfort  Wear a good support bra for breast tenderness.   Take warm sitz baths to soothe any pain or discomfort caused by hemorrhoids. Use hemorrhoid cream if your health care provider approves.   Rest with your legs elevated if you have leg cramps or low back pain.  If you develop varicose veins in your legs, wear support hose. Elevate your feet for 15 minutes, 3-4 times a day. Limit salt in your diet. Prenatal Care  Schedule your prenatal visits by the twelfth week of pregnancy. They are usually scheduled monthly at first, then more often in the last 2 months before delivery.  Write down your questions. Take them to your prenatal visits.  Keep all your prenatal visits as directed by your health care provider. Safety  Wear your seat belt at all times when driving.  Make a list of emergency phone numbers, including numbers for family, friends, the hospital, and police and fire departments. General   Tips  Ask your health care provider for a referral to a local prenatal education class. Begin classes no later than at the beginning of month 6 of your pregnancy.  Ask for help if you have counseling or nutritional needs during pregnancy. Your health care provider can offer advice or refer you to specialists for help with various needs.  Do not use hot tubs, steam rooms, or saunas.  Do not douche or use tampons or scented sanitary pads.  Do not cross your legs for long periods of time.  Avoid cat litter boxes and soil used by cats. These carry germs that can cause birth defects in the baby and possibly loss of the fetus by miscarriage or stillbirth.  Avoid all smoking, herbs, alcohol, and medicines not prescribed by your health care provider. Chemicals in these affect the formation and growth of the baby.  Schedule a dentist appointment. At home, brush your teeth with a soft toothbrush and be gentle when you floss. SEEK MEDICAL CARE IF:   You have  dizziness.  You have mild pelvic cramps, pelvic pressure, or nagging pain in the abdominal area.  You have persistent nausea, vomiting, or diarrhea.  You have a bad smelling vaginal discharge.  You have pain with urination.  You notice increased swelling in your face, hands, legs, or ankles. SEEK IMMEDIATE MEDICAL CARE IF:   You have a fever.  You are leaking fluid from your vagina.  You have spotting or bleeding from your vagina.  You have severe abdominal cramping or pain.  You have rapid weight gain or loss.  You vomit blood or material that looks like coffee grounds.  You are exposed to German measles and have never had them.  You are exposed to fifth disease or chickenpox.  You develop a severe headache.  You have shortness of breath.  You have any kind of trauma, such as from a fall or a car accident. Document Released: 07/20/2001 Document Revised: 12/10/2013 Document Reviewed: 06/05/2013 Exit[employer] Patient Information 2015 [employer]. This information is not intended to replace advice given to you by your health care provider. Make sure you discuss any questions you have with your health care provider.   Nausea & Vomiting  Have saltine crackers or pretzels by your bed and eat a few bites before you raise your head out of bed in the morning  Eat small frequent meals throughout the day instead of large meals  Drink plenty of fluids throughout the day to stay hydrated, just don't drink a lot of fluids with your meals.  This can make your stomach fill up faster making you feel sick  Do not brush your teeth right after you eat  Products with real ginger are good for nausea, like ginger ale and ginger hard candy Make sure it says made with real ginger!  Sucking on sour candy like lemon heads is also good for nausea  If your prenatal vitamins make you nauseated, take them at night so you will sleep through the nausea  Sea Bands  If you feel like you need  medicine for the nausea & vomiting please let us know  If you are unable to keep any fluids or food down please let us know   Constipation  Drink plenty of fluid, preferably water, throughout the day  Eat foods high in fiber such as fruits, vegetables, and grains  Exercise, such as walking, is a good way to keep your bowels regular  Drink warm fluids, especially warm   prune juice, or decaf coffee  Eat a 1/2 cup of real oatmeal (not instant), 1/2 cup applesauce, and 1/2-1 cup warm prune juice every day  If needed, you may take Colace (docusate sodium) stool softener once or twice a day to help keep the stool soft. If you are pregnant, wait until you are out of your first trimester (12-14 weeks of pregnancy)  If you still are having problems with constipation, you may take Miralax once daily as needed to help keep your bowels regular.  If you are pregnant, wait until you are out of your first trimester (12-14 weeks of pregnancy)  Safe Medications in Pregnancy   Acne: Benzoyl Peroxide Salicylic Acid  Backache/Headache: Tylenol: 2 regular strength every 4 hours OR              2 Extra strength every 6 hours  Colds/Coughs/Allergies: Benadryl (alcohol free) 25 mg every 6 hours as needed Breath right strips Claritin Cepacol throat lozenges Chloraseptic throat spray Cold-Eeze- up to three times per day Cough drops, alcohol free Flonase (by prescription only) Guaifenesin Mucinex Robitussin DM (plain only, alcohol free) Saline nasal spray/drops Sudafed (pseudoephedrine) & Actifed ** use only after [redacted] weeks gestation and if you do not have high blood pressure Tylenol Vicks Vaporub Zinc lozenges Zyrtec   Constipation: Colace Ducolax suppositories Fleet enema Glycerin suppositories Metamucil Milk of magnesia Miralax Senokot Smooth move tea  Diarrhea: Kaopectate Imodium A-D  *NO pepto Bismol  Hemorrhoids: Anusol Anusol HC Preparation  H Tucks  Indigestion: Tums Maalox Mylanta Zantac  Pepcid  Insomnia: Benadryl (alcohol free) 25mg every 6 hours as needed Tylenol PM Unisom, no Gelcaps  Leg Cramps: Tums MagGel  Nausea/Vomiting:  Bonine Dramamine Emetrol Ginger extract Sea bands Meclizine  Nausea medication to take during pregnancy:  Unisom (doxylamine succinate 25 mg tablets) Take one tablet daily at bedtime. If symptoms are not adequately controlled, the dose can be increased to a maximum recommended dose of two tablets daily (1/2 tablet in the morning, 1/2 tablet mid-afternoon and one at bedtime). Vitamin B6 100mg tablets. Take one tablet twice a day (up to 200 mg per day).  Skin Rashes: Aveeno products Benadryl cream or 25mg every 6 hours as needed Calamine Lotion 1% cortisone cream  Yeast infection: Gyne-lotrimin 7 Monistat 7   **If taking multiple medications, please check labels to avoid duplicating the same active ingredients **take medication as directed on the label ** Do not exceed 4000 mg of tylenol in 24 hours **Do not take medications that contain aspirin or ibuprofen      

## 2017-05-11 NOTE — Progress Notes (Signed)
Subjective:    Tiffany [name] is a G1P0 106w4d being seen today for her first obstetrical visit.  Her obstetrical history is significant for first pregnancy.  Pregnancy history fully reviewed.  Patient reports no complaints. Hx of depression, was taking a medicine ? Can't remember.  Doesn't want therapy (says it doesn't help) . Discussed advantages may consider Feels like she's doing OK. Only uses inhaler about once a week. .  Vitals:   05/11/17 1049  BP: 110/70  Pulse: 70  Weight: 159 lb (72.1 kg)    HISTORY: OB History  Gravida Para Term Preterm AB Living  1            SAB TAB Ectopic Multiple Live Births               # Outcome Date GA Lbr Len/2nd Weight Sex Delivery Anes PTL Lv  1 Current              Past Medical History:  Diagnosis Date  . Anxiety   . Asthma   . Bronchitis in child   . Depression   . Eczema    Past Surgical History:  Procedure Laterality Date  . WISDOM TOOTH EXTRACTION     Family History  Problem Relation Age of Onset  . COPD Mother   . Diabetes Mother        pre-diabetes  . Other Mother        boils  . COPD Paternal Grandfather   . Diabetes Paternal Grandmother   . COPD Maternal Grandmother   . Thyroid disease Maternal Grandmother   . Aneurysm Maternal Grandfather   . Thyroid disease Maternal Aunt      Exam                                      System:     Skin: normal coloration and turgor, no rashes    Neurologic: oriented, normal, normal mood   Extremities: normal strength, tone, and muscle mass   HEENT PERRLA   Mouth/Teeth mucous membranes moist, normal dentition   Neck supple and no masses   Cardiovascular: regular rate and rhythm   Respiratory:  appears well, vitals normal, no respiratory distress, acyanotic   Abdomen: soft, non-tender;  FHR: 160        The nature of [facility] with multiple MDs and other Advanced Practice Providers was explained to patient; also  emphasized that residents, students are part of our team.  Assessment:    Pregnancy: G1P0 Patient Active Problem List   Diagnosis Date Noted  . Supervision of normal first pregnancy 05/11/2017  . Marijuana use 05/11/2017  . Less than [redacted] weeks gestation of pregnancy 03/28/2017  . Pregnancy examination or test, positive result 03/28/2017  . Nausea and vomiting during pregnancy prior to [redacted] weeks gestation 03/28/2017  . Encounter to determine fetal viability of pregnancy 03/28/2017  . Gastroesophageal reflux disease without esophagitis 03/07/2017  . Depression 03/07/2017  . Intrinsic eczema 03/07/2017        Plan:     Initial labs drawn. Continue prenatal vitamins [facility] and [facility] referrals made  Problem list reviewed and updated  Reviewed n/v relief measures and warning s/s to report  Reviewed recommended weight gain based on pre-gravid BMI  Encouraged well-balanced diet Genetic Screening discussed Integrated Screen: requested.  Ultrasound discussed; fetal survey: requested.  Return for asap  for NT only; 4 weeks for LROB.  CRESENZO-DISHMAN,Cherilynn Schomburg 05/11/2017

## 2017-05-13 ENCOUNTER — Telehealth: Payer: Self-pay | Admitting: *Deleted

## 2017-05-13 ENCOUNTER — Other Ambulatory Visit (HOSPITAL_COMMUNITY): Payer: Self-pay | Admitting: Advanced Practice Midwife

## 2017-05-13 DIAGNOSIS — Z3682 Encounter for antenatal screening for nuchal translucency: Secondary | ICD-10-CM

## 2017-05-13 LAB — GC/CHLAMYDIA PROBE AMP
Chlamydia trachomatis, NAA: NEGATIVE
NEISSERIA GONORRHOEAE BY PCR: NEGATIVE

## 2017-05-13 LAB — URINE CULTURE

## 2017-05-13 NOTE — Telephone Encounter (Signed)
Informed pharmacy Diclegis was approved.

## 2017-05-16 ENCOUNTER — Other Ambulatory Visit: Payer: Medicaid Other | Admitting: *Deleted

## 2017-05-16 ENCOUNTER — Ambulatory Visit (INDEPENDENT_AMBULATORY_CARE_PROVIDER_SITE_OTHER): Payer: Medicaid Other

## 2017-05-16 DIAGNOSIS — Z3682 Encounter for antenatal screening for nuchal translucency: Secondary | ICD-10-CM | POA: Diagnosis not present

## 2017-05-16 DIAGNOSIS — F1290 Cannabis use, unspecified, uncomplicated: Secondary | ICD-10-CM

## 2017-05-16 DIAGNOSIS — Z3401 Encounter for supervision of normal first pregnancy, first trimester: Secondary | ICD-10-CM

## 2017-05-16 NOTE — Progress Notes (Signed)
[place] 13+2 wks,measurements c/w dates,crl 72.22 mm,NT 1.5 mm,NB present,normal ovaries bilat,ant pl gr 0,fhr 153 bpm

## 2017-05-17 LAB — RUBELLA SCREEN: Rubella Antibodies, IGG: <0.9 index — ABNORMAL LOW (ref >0.99)

## 2017-05-17 LAB — CYSTIC FIBROSIS MUTATION 97: Interpretation: NOT DETECTED

## 2017-05-17 LAB — URINALYSIS, ROUTINE W REFLEX MICROSCOPIC
Bilirubin, UA: NEGATIVE
Glucose, UA: NEGATIVE
Nitrite, UA: NEGATIVE
PH UA: 5 (ref 5.0–7.5)
RBC, UA: NEGATIVE
Urobilinogen, Ur: 0.2 mg/dL (ref 0.2–1.0)

## 2017-05-17 LAB — AB SCR+ANTIBODY ID: Antibody Screen: POSITIVE — AB

## 2017-05-17 LAB — MICROSCOPIC EXAMINATION
Casts: NONE SEEN /lpf
Epithelial Cells (non renal): >10 /hpf — AB (ref 0–10)

## 2017-05-17 LAB — CBC
Hematocrit: 40.9 % (ref 34.0–46.6)
Hemoglobin: 13.1 g/dL (ref 11.1–15.9)
MCH: 29.1 pg (ref 26.6–33.0)
MCHC: 32 g/dL (ref 31.5–35.7)
MCV: 91 fL (ref 79–97)
PLATELETS: 383 10*3/uL — AB (ref 150–379)
RBC: 4.5 x10E6/uL (ref 3.77–5.28)
RDW: 14.5 % (ref 12.3–15.4)
WBC: 14.3 10*3/uL — ABNORMAL HIGH (ref 3.4–10.8)

## 2017-05-17 LAB — VARICELLA ZOSTER ANTIBODY, IGG: Varicella zoster IgG: 362 index (ref >165)

## 2017-05-17 LAB — RPR: RPR Ser Ql: NONREACTIVE

## 2017-05-17 LAB — ANTIBODY SCREEN

## 2017-05-17 LAB — ABO/RH: RH TYPE: POSITIVE

## 2017-05-17 LAB — HEPATITIS B SURFACE ANTIGEN: Hepatitis B Surface Ag: NEGATIVE

## 2017-05-17 LAB — HIV ANTIBODY (ROUTINE TESTING W REFLEX): HIV Screen 4th Generation wRfx: NONREACTIVE

## 2017-05-19 LAB — INTEGRATED 1
CROWN RUMP LENGTH MAT SCREEN: 72.2 mm
Gest. Age on Collection Date: 13 weeks
MATERNAL AGE AT EDD: 19.7 a
NUMBER OF FETUSES: 1
Nuchal Translucency (NT): 1.5 mm
PAPP-A Value: 643.8 ng/mL
WEIGHT: 158 [lb_av]

## 2017-06-09 ENCOUNTER — Encounter: Payer: Self-pay | Admitting: Advanced Practice Midwife

## 2017-06-09 ENCOUNTER — Ambulatory Visit (INDEPENDENT_AMBULATORY_CARE_PROVIDER_SITE_OTHER): Payer: Medicaid Other | Admitting: Advanced Practice Midwife

## 2017-06-09 VITALS — BP 100/60 | HR 97 | Wt 156.0 lb

## 2017-06-09 DIAGNOSIS — R760 Raised antibody titer: Secondary | ICD-10-CM

## 2017-06-09 DIAGNOSIS — R768 Other specified abnormal immunological findings in serum: Secondary | ICD-10-CM

## 2017-06-09 DIAGNOSIS — Z3A16 16 weeks gestation of pregnancy: Secondary | ICD-10-CM

## 2017-06-09 DIAGNOSIS — Z363 Encounter for antenatal screening for malformations: Secondary | ICD-10-CM

## 2017-06-09 DIAGNOSIS — Z1379 Encounter for other screening for genetic and chromosomal anomalies: Secondary | ICD-10-CM

## 2017-06-09 DIAGNOSIS — Z331 Pregnant state, incidental: Secondary | ICD-10-CM

## 2017-06-09 DIAGNOSIS — Z1389 Encounter for screening for other disorder: Secondary | ICD-10-CM

## 2017-06-09 DIAGNOSIS — Z3402 Encounter for supervision of normal first pregnancy, second trimester: Secondary | ICD-10-CM

## 2017-06-09 DIAGNOSIS — O288 Other abnormal findings on antenatal screening of mother: Secondary | ICD-10-CM

## 2017-06-09 LAB — POCT URINALYSIS DIPSTICK
Blood, UA: NEGATIVE
Glucose, UA: NEGATIVE
LEUKOCYTES UA: NEGATIVE
NITRITE UA: NEGATIVE

## 2017-06-09 NOTE — Patient Instructions (Addendum)
Second Trimester of Pregnancy The second trimester is from week 14 through week 27 (months 4 through 6). The second trimester is often a time when you feel your best. Your body has adjusted to being pregnant, and you begin to feel better physically. Usually, morning sickness has lessened or quit completely, you may have more energy, and you may have an increase in appetite. The second trimester is also a time when the fetus is growing rapidly. At the end of the sixth month, the fetus is about 9 inches long and weighs about 1 pounds. You will likely begin to feel the baby move (quickening) between 16 and 20 weeks of pregnancy. Body changes during your second trimester Your body continues to go through many changes during your second trimester. The changes vary from woman to woman.  Your weight will continue to increase. You will notice your lower abdomen bulging out.  You may begin to get stretch marks on your hips, abdomen, and breasts.  You may develop headaches that can be relieved by medicines. The medicines should be approved by your health care provider.  You may urinate more often because the fetus is pressing on your bladder.  You may develop or continue to have heartburn as a result of your pregnancy.  You may develop constipation because certain hormones are causing the muscles that push waste through your intestines to slow down.  You may develop hemorrhoids or swollen, bulging veins (varicose veins).  You may have back pain. This is caused by: ? Weight gain. ? Pregnancy hormones that are relaxing the joints in your pelvis. ? A shift in weight and the muscles that support your balance.  Your breasts will continue to grow and they will continue to become tender.  Your gums may bleed and may be sensitive to brushing and flossing.  Dark spots or blotches (chloasma, mask of pregnancy) may develop on your face. This will likely fade after the baby is born.  A dark line from your  belly button to the pubic area (linea nigra) may appear. This will likely fade after the baby is born.  You may have changes in your hair. These can include thickening of your hair, rapid growth, and changes in texture. Some women also have hair loss during or after pregnancy, or hair that feels dry or thin. Your hair will most likely return to normal after your baby is born.  What to expect at prenatal visits During a routine prenatal visit:  You will be weighed to make sure you and the fetus are growing normally.  Your blood pressure will be taken.  Your abdomen will be measured to track your baby's growth.  The fetal heartbeat will be listened to.  Any test results from the previous visit will be discussed.  Your health care provider may ask you:  How you are feeling.  If you are feeling the baby move.  If you have had any abnormal symptoms, such as leaking fluid, bleeding, severe headaches, or abdominal cramping.  If you are using any tobacco products, including cigarettes, chewing tobacco, and electronic cigarettes.  If you have any questions.  Other tests that may be performed during your second trimester include:  Blood tests that check for: ? Low iron levels (anemia). ? High blood sugar that affects pregnant women (gestational diabetes) between 24 and 28 weeks. ? Rh antibodies. This is to check for a protein on red blood cells (Rh factor).  Urine tests to check for infections, diabetes, or   protein in the urine.  An ultrasound to confirm the proper growth and development of the baby.  An amniocentesis to check for possible genetic problems.  Fetal screens for spina bifida and Down syndrome.  HIV (human immunodeficiency virus) testing. Routine prenatal testing includes screening for HIV, unless you choose not to have this test.  Follow these instructions at home: Medicines  Follow your health care provider's instructions regarding medicine use. Specific  medicines may be either safe or unsafe to take during pregnancy.  Take a prenatal vitamin that contains at least 600 micrograms (mcg) of folic acid.  If you develop constipation, try taking a stool softener if your health care provider approves. Eating and drinking  Eat a balanced diet that includes fresh fruits and vegetables, whole grains, good sources of protein such as meat, eggs, or tofu, and low-fat dairy. Your health care provider will help you determine the amount of weight gain that is right for you.  Avoid raw meat and uncooked cheese. These carry germs that can cause birth defects in the baby.  If you have low calcium intake from food, talk to your health care provider about whether you should take a daily calcium supplement.  Limit foods that are high in fat and processed sugars, such as fried and sweet foods.  To prevent constipation: ? Drink enough fluid to keep your urine clear or pale yellow. ? Eat foods that are high in fiber, such as fresh fruits and vegetables, whole grains, and beans. Activity  Exercise only as directed by your health care provider. Most women can continue their usual exercise routine during pregnancy. Try to exercise for 30 minutes at least 5 days a week. Stop exercising if you experience uterine contractions.  Avoid heavy lifting, wear low heel shoes, and practice good posture.  A sexual relationship may be continued unless your health care provider directs you otherwise. Relieving pain and discomfort  Wear a good support bra to prevent discomfort from breast tenderness.  Take warm sitz baths to soothe any pain or discomfort caused by hemorrhoids. Use hemorrhoid cream if your health care provider approves.  Rest with your legs elevated if you have leg cramps or low back pain.  If you develop varicose veins, wear support hose. Elevate your feet for 15 minutes, 3-4 times a day. Limit salt in your diet. Prenatal Care  Write down your questions.  Take them to your prenatal visits.  Keep all your prenatal visits as told by your health care provider. This is important. Safety  Wear your seat belt at all times when driving.  Make a list of emergency phone numbers, including numbers for family, friends, the hospital, and police and fire departments. General instructions  Ask your health care provider for a referral to a local prenatal education class. Begin classes no later than the beginning of month 6 of your pregnancy.  Ask for help if you have counseling or nutritional needs during pregnancy. Your health care provider can offer advice or refer you to specialists for help with various needs.  Do not use hot tubs, steam rooms, or saunas.  Do not douche or use tampons or scented sanitary pads.  Do not cross your legs for long periods of time.  Avoid cat litter boxes and soil used by cats. These carry germs that can cause birth defects in the baby and possibly loss of the fetus by miscarriage or stillbirth.  Avoid all smoking, herbs, alcohol, and unprescribed drugs. Chemicals in these products can   affect the formation and growth of the baby.  Do not use any products that contain nicotine or tobacco, such as cigarettes and e-cigarettes. If you need help quitting, ask your health care provider.  Visit your dentist if you have not gone yet during your pregnancy. Use a soft toothbrush to brush your teeth and be gentle when you floss. Contact a health care provider if:  You have dizziness.  You have mild pelvic cramps, pelvic pressure, or nagging pain in the abdominal area.  You have persistent nausea, vomiting, or diarrhea.  You have a bad smelling vaginal discharge.  You have pain when you urinate. Get help right away if:  You have a fever.  You are leaking fluid from your vagina.  You have spotting or bleeding from your vagina.  You have severe abdominal cramping or pain.  You have rapid weight gain or weight  loss.  You have shortness of breath with chest pain.  You notice sudden or extreme swelling of your face, hands, ankles, feet, or legs.  You have not felt your baby move in over an hour.  You have severe headaches that do not go away when you take medicine.  You have vision changes. Summary  The second trimester is from week 14 through week 27 (months 4 through 6). It is also a time when the fetus is growing rapidly.  Your body goes through many changes during pregnancy. The changes vary from woman to woman.  Avoid all smoking, herbs, alcohol, and unprescribed drugs. These chemicals affect the formation and growth your baby.  Do not use any tobacco products, such as cigarettes, chewing tobacco, and e-cigarettes. If you need help quitting, ask your health care provider.  Contact your health care provider if you have any questions. Keep all prenatal visits as told by your health care provider. This is important. This information is not intended to replace advice given to you by your health care provider. Make sure you discuss any questions you have with your health care provider.      CHILDBIRTH CLASSES [phone] is the phone number for Pregnancy Classes or hospital tours at [facility].   You will be referred to  [email or web address] for more information on childbirth classes  At this site you may register for classes. You may sign up for a waiting list if classes are full. Please SIGN UP FOR THIS!.   When the waiting list becomes long, sometimes new classes can be added.   Safe Medications in Pregnancy   Acne: Benzoyl Peroxide Salicylic Acid  Backache/Headache: Tylenol: 2 regular strength every 4 hours OR              2 Extra strength every 6 hours  Colds/Coughs/Allergies: Benadryl (alcohol free) 25 mg every 6 hours as needed Breath right strips Claritin Cepacol throat  lozenges Chloraseptic throat spray Cold-Eeze- up to three times per day Cough drops, alcohol free Flonase (by prescription only) Guaifenesin Mucinex Robitussin DM (plain only, alcohol free) Saline nasal spray/drops Sudafed (pseudoephedrine) & Actifed ** use only after [redacted] weeks gestation and if you do not have high blood pressure Tylenol Vicks Vaporub Zinc lozenges Zyrtec   Constipation: Colace Ducolax suppositories Fleet enema Glycerin suppositories Metamucil Milk of magnesia Miralax Senokot Smooth move tea  Diarrhea: Kaopectate Imodium A-D  *NO pepto Bismol  Hemorrhoids: Anusol Anusol HC Preparation H Tucks  Indigestion: Tums Maalox Mylanta Zantac  Pepcid  Insomnia: Benadryl (alcohol free) 25mg  every 6 hours as needed Tylenol PM Unisom,  no Gelcaps  Leg Cramps: Tums MagGel  Nausea/Vomiting:  Bonine Dramamine Emetrol Ginger extract Sea bands Meclizine  Nausea medication to take during pregnancy:  Unisom (doxylamine succinate 25 mg tablets) Take one tablet daily at bedtime. If symptoms are not adequately controlled, the dose can be increased to a maximum recommended dose of two tablets daily (1/2 tablet in the morning, 1/2 tablet mid-afternoon and one at bedtime). Vitamin B6 100mg  tablets. Take one tablet twice a day (up to 200 mg per day).  Skin Rashes: Aveeno products Benadryl cream or 25mg  every 6 hours as needed Calamine Lotion 1% cortisone cream  Yeast infection: Gyne-lotrimin 7 Monistat 7   **If taking multiple medications, please check labels to avoid duplicating the same active ingredients **take medication as directed on the label ** Do not exceed 4000 mg of tylenol in 24 hours **Do not take medications that contain aspirin or ibuprofen

## 2017-06-09 NOTE — Progress Notes (Signed)
G1P0 [id number] Estimated Date of Delivery: 11/19/17  Blood pressure 100/60, pulse 97, weight 156 lb (70.8 kg), last menstrual period 02/27/2017.   BP weight and urine results all reviewed and noted.  Please refer to the obstetrical flow sheet for the fundal height and fetal heart rate documentation:  Appetite still not that great  rx diclegis (had been getting samples). Prior auth done  Patient denies any bleeding and no rupture of membranes symptoms or regular contractions. Patient is without complaints. All questions were answered.  Orders Placed This Encounter  Procedures  . US OB Comp + 14 Wk  . INTEGRATED 2  . Antibody screen    Plan:  Continued routine obstetrical care, 2nd IT today Repeat antibody screen (was + but too week to titer/decipher which one) Return in about 3 weeks (around 06/27/2017) for LROB, ZO:XWRUEAVS:Anatomy.

## 2017-06-09 NOTE — Addendum Note (Signed)
Addended by: Federico FlakeNES, Janyra Barillas A on: 06/09/2017 10:27 AM   Modules accepted: Orders

## 2017-06-10 LAB — ANTIBODY SCREEN: Antibody Screen: NEGATIVE

## 2017-06-14 LAB — INTEGRATED 2
ADSF: 0.96
AFP MOM: 1.04
Alpha-Fetoprotein: 33.1 ng/mL
Crown Rump Length: 72.2 mm
DIA MoM: 1.03
DIA VALUE: 168.2 pg/mL
Estriol, Unconjugated: 0.8 ng/mL
GEST. AGE ON COLLECTION DATE: 13 wk
Gestational Age: 16.4 weeks
HCG MOM: 0.33
HCG VALUE: 10 [IU]/mL
MATERNAL AGE AT EDD: 19.7 a
NUCHAL TRANSLUCENCY (NT): 1.5 mm
NUMBER OF FETUSES: 1
Nuchal Translucency MoM: 0.84
PAPP-A MoM: 0.61
PAPP-A Value: 643.8 ng/mL
Test Results:: NEGATIVE
WEIGHT: 156 [lb_av]
WEIGHT: 158 [lb_av]

## 2017-06-16 ENCOUNTER — Inpatient Hospital Stay (HOSPITAL_COMMUNITY)
Admission: EM | Admit: 2017-06-16 | Discharge: 2017-06-19 | DRG: 832 | Disposition: A | Discharge status: Home / Self Care | Payer: Medicaid Other | Attending: Obstetrics & Gynecology | Admitting: Obstetrics & Gynecology

## 2017-06-16 ENCOUNTER — Encounter (HOSPITAL_COMMUNITY): Payer: Self-pay | Admitting: *Deleted

## 2017-06-16 ENCOUNTER — Emergency Department (HOSPITAL_COMMUNITY): Payer: Medicaid Other

## 2017-06-16 DIAGNOSIS — O99342 Other mental disorders complicating pregnancy, second trimester: Secondary | ICD-10-CM | POA: Diagnosis present

## 2017-06-16 DIAGNOSIS — O99612 Diseases of the digestive system complicating pregnancy, second trimester: Secondary | ICD-10-CM | POA: Diagnosis present

## 2017-06-16 DIAGNOSIS — E2749 Other adrenocortical insufficiency: Secondary | ICD-10-CM | POA: Diagnosis present

## 2017-06-16 DIAGNOSIS — F329 Major depressive disorder, single episode, unspecified: Secondary | ICD-10-CM | POA: Diagnosis present

## 2017-06-16 DIAGNOSIS — K219 Gastro-esophageal reflux disease without esophagitis: Secondary | ICD-10-CM | POA: Diagnosis present

## 2017-06-16 DIAGNOSIS — Z87891 Personal history of nicotine dependence: Secondary | ICD-10-CM

## 2017-06-16 DIAGNOSIS — O99282 Endocrine, nutritional and metabolic diseases complicating pregnancy, second trimester: Principal | ICD-10-CM | POA: Diagnosis present

## 2017-06-16 DIAGNOSIS — F1290 Cannabis use, unspecified, uncomplicated: Secondary | ICD-10-CM | POA: Diagnosis present

## 2017-06-16 DIAGNOSIS — R109 Unspecified abdominal pain: Secondary | ICD-10-CM

## 2017-06-16 DIAGNOSIS — Z3A18 18 weeks gestation of pregnancy: Secondary | ICD-10-CM

## 2017-06-16 DIAGNOSIS — F418 Other specified anxiety disorders: Secondary | ICD-10-CM | POA: Diagnosis present

## 2017-06-16 DIAGNOSIS — O99322 Drug use complicating pregnancy, second trimester: Secondary | ICD-10-CM | POA: Diagnosis present

## 2017-06-16 DIAGNOSIS — D72829 Elevated white blood cell count, unspecified: Secondary | ICD-10-CM

## 2017-06-16 LAB — CBC
HCT: 37 % (ref 36.0–46.0)
HEMOGLOBIN: 12.2 g/dL (ref 12.0–15.0)
MCH: 30.2 pg (ref 26.0–34.0)
MCHC: 33 g/dL (ref 30.0–36.0)
MCV: 91.6 fL (ref 78.0–100.0)
Platelets: 340 10*3/uL (ref 150–400)
RBC: 4.04 MIL/uL (ref 3.87–5.11)
RDW: 14 % (ref 11.5–15.5)
WBC: 23.2 10*3/uL — AB (ref 4.0–10.5)

## 2017-06-16 LAB — COMPREHENSIVE METABOLIC PANEL
ALBUMIN: 3.3 g/dL — AB (ref 3.5–5.0)
ALT: 30 U/L (ref 14–54)
AST: 26 U/L (ref 15–41)
Alkaline Phosphatase: 62 U/L (ref 38–126)
Anion gap: 10 (ref 5–15)
BILIRUBIN TOTAL: 0.4 mg/dL (ref 0.3–1.2)
BUN: 5 mg/dL — ABNORMAL LOW (ref 6–20)
CALCIUM: 9.1 mg/dL (ref 8.9–10.3)
CHLORIDE: 104 mmol/L (ref 101–111)
CO2: 23 mmol/L (ref 22–32)
Creatinine, Ser: 0.54 mg/dL (ref 0.44–1.00)
GFR calc Af Amer: >60 mL/min (ref >60)
GFR calc non Af Amer: >60 mL/min (ref >60)
Glucose, Bld: 136 mg/dL — ABNORMAL HIGH (ref 65–99)
POTASSIUM: 3.4 mmol/L — AB (ref 3.5–5.1)
Sodium: 137 mmol/L (ref 135–145)
Total Protein: 6.5 g/dL (ref 6.5–8.1)

## 2017-06-16 LAB — LIPASE, BLOOD: Lipase: 24 U/L (ref 11–51)

## 2017-06-16 MED ORDER — ACETAMINOPHEN 325 MG PO TABS
650.0000 mg | ORAL_TABLET | Freq: Once | ORAL | Status: AC
  Administered 2017-06-17: 650 mg via ORAL
  Filled 2017-06-16: qty 2

## 2017-06-16 MED ORDER — LACTATED RINGERS IV BOLUS (SEPSIS)
1000.0000 mL | Freq: Once | INTRAVENOUS | Status: AC
  Administered 2017-06-16: 1000 mL via INTRAVENOUS

## 2017-06-16 MED ORDER — ONDANSETRON HCL 4 MG/2ML IJ SOLN
4.0000 mg | Freq: Once | INTRAMUSCULAR | Status: AC
  Administered 2017-06-17: 4 mg via INTRAVENOUS
  Filled 2017-06-16: qty 2

## 2017-06-16 NOTE — ED Triage Notes (Signed)
Pt with c/o right rib pain with vomiting unknown how many times.  Denies diarrhea. Last prenatal visit was normal per pt.

## 2017-06-16 NOTE — ED Notes (Signed)
Pt states cough, nasal congestion, N/V/, and R rib pain. This is pt's first pregnancy, states she has taken a medication that starts with a "D" and Tylenol today. States she has not yet felt baby move. Normal PO intake and urination, denies vaginal blding or d/c.

## 2017-06-16 NOTE — ED Provider Notes (Addendum)
[facility] EMERGENCY DEPARTMENT Provider Note   CSN: [id number] Arrival date & time: 06/16/17  2214     History   Chief Complaint Chief Complaint  Patient presents with  . Abdominal Pain    HPI Tiffany [name] is a 21 y.o. female.  HPI  G1 P0 21 y.o female comes in with chief complaint of emesis.  Patient reports that since this morning she has had several rounds of emesis.  As far she can remember the emesis were not bilious or bloody.  Patient also complains of right-sided flank pain.  Pain in the flank region was intermittent, however at night it became fairly constant.  Patient denies any dysuria, dysuria, urinary frequency.  Patient has a mild cough.  Patient is unsure if she is having any fevers, but does report having some chills.  Patient has no history of abdominal surgeries or pelvic disorder.   Past Medical History:  Diagnosis Date  . Anxiety   . Asthma   . Bronchitis in child   . Depression   . Eczema     Patient Active Problem List   Diagnosis Date Noted  . Supervision of normal first pregnancy 05/11/2017  . Marijuana use 05/11/2017  . Gastroesophageal reflux disease without esophagitis 03/07/2017  . Depression 03/07/2017  . Intrinsic eczema 03/07/2017    Past Surgical History:  Procedure Laterality Date  . WISDOM TOOTH EXTRACTION      OB History    Gravida Para Term Preterm AB Living   1             SAB TAB Ectopic Multiple Live Births                   Home Medications    Prior to Admission medications   Medication Sig Start Date End Date Taking? Authorizing Provider  albuterol (PROVENTIL HFA;VENTOLIN HFA) 108 (90 Base) MCG/ACT inhaler Inhale into the lungs every 6 (six) hours as needed for wheezing or shortness of breath.    [provider]  Doxylamine-Pyridoxine 10-10 MG TBEC 2 PO qhs; may take 1po in am and 1po in afternoon prn nausea 05/11/17   Cresenzo-Dishmon, Scarlette Calico, CNM  escitalopram (LEXAPRO) 10 MG tablet Take 1 tablet  (10 mg total) by mouth daily. Patient not taking: Reported on 05/11/2017 03/28/17   Adline Potter, NP  flintstones complete [facility]) 60 MG chewable tablet Take 2 daily 03/28/17   Cyril Mourning A, NP  metoCLOPramide (REGLAN) 10 MG tablet Take 1 tablet (10 mg total) by mouth every 6 (six) hours as needed for nausea (nausea/headache). Patient not taking: Reported on 05/11/2017 03/15/17   Gilda Crease, MD  triamcinolone cream (KENALOG) 0.1 % Apply 1 application topically 2 (two) times daily. 03/07/17   Remus Loffler, PA-C  triamcinolone ointment (KENALOG) 0.1 % Apply to soaking wet skin on legs BID 12/12/12   [provider]    Family History Family History  Problem Relation Age of Onset  . COPD Mother   . Diabetes Mother        pre-diabetes  . Other Mother        boils  . COPD Paternal Grandfather   . Diabetes Paternal Grandmother   . COPD Maternal Grandmother   . Thyroid disease Maternal Grandmother   . Aneurysm Maternal Grandfather   . Thyroid disease Maternal Aunt     Social History Social History   Tobacco Use  . Smoking status: Former Smoker    Years: 1.00  Types: Cigarettes  . Smokeless tobacco: Never Used  Substance Use Topics  . Alcohol use: No  . Drug use: Yes    Types: Marijuana    Comment: daily;"helps with nausea"     Allergies   Patient has no known allergies.   Review of Systems Review of Systems  Constitutional: Positive for activity change.  Respiratory: Positive for cough. Negative for shortness of breath and stridor.   Cardiovascular: Negative for chest pain.  Gastrointestinal: Positive for nausea and vomiting. Negative for abdominal pain.  Genitourinary: Positive for flank pain. Negative for dysuria, hematuria, pelvic pain, vaginal bleeding and vaginal discharge.  Neurological: Negative for headaches.  All other systems reviewed and are negative.    Physical Exam Updated Vital Signs BP 116/63   Pulse 65   Temp 98  F (36.7 C) (Oral)   Resp 20   Ht 5' 2.5" (1.588 m)   Wt 74.8 kg (165 lb)   LMP 02/27/2017 (Approximate)   SpO2 98%   BMI 29.70 kg/m   Physical Exam  Constitutional: She is oriented to person, place, and time. She appears well-developed.  HENT:  Head: Normocephalic and atraumatic.  Eyes: EOM are normal.  Neck: Normal range of motion. Neck supple.  Cardiovascular: Normal rate.  Pulmonary/Chest: Effort normal.  Abdominal: Soft. Normal appearance and bowel sounds are normal. She exhibits no ascites. There is tenderness in the right upper quadrant. There is no rebound, no guarding, no tenderness at McBurney's point and negative Murphy's sign.  + flank tenderness, R side  Neurological: She is alert and oriented to person, place, and time.  Skin: Skin is warm and dry.  Nursing note and vitals reviewed.    ED Treatments / Results  Labs (all labs ordered are listed, but only abnormal results are displayed) Labs Reviewed  COMPREHENSIVE METABOLIC PANEL - Abnormal; Notable for the following components:      Result Value   Potassium 3.4 (*)    Glucose, Bld 136 (*)    BUN 5 (*)    Albumin 3.3 (*)    All other components within normal limits  CBC - Abnormal; Notable for the following components:   WBC 23.2 (*)    All other components within normal limits  URINALYSIS, ROUTINE W REFLEX MICROSCOPIC - Abnormal; Notable for the following components:   Ketones, ur 5 (*)    All other components within normal limits  LIPASE, BLOOD    EKG  EKG Interpretation  Date/Time:  Friday June 17 2017 01:12:56 EST Ventricular Rate:  66 PR Interval:    QRS Duration: 94 QT Interval:  391 QTC Calculation: 410 R Axis:   73 Text Interpretation:  Sinus rhythm Atrial premature complex No acute changes No old tracing to compare Confirmed by Derwood Kaplan [phone]) on 06/17/2017 8:08:07 AM       Radiology Dg Chest 2 View  Result Date: 06/17/2017 CLINICAL DATA:  Right-sided lower chest pain,  cough, nausea, and vomiting. Patient is pregnant and was shielded. EXAM: CHEST  2 VIEW COMPARISON:  10/11/2016 FINDINGS: Shallow inspiration with linear atelectasis in the lung bases. Appearances similar to prior study. No consolidation or edema. No blunting of costophrenic angles. No pneumothorax. Heart size and pulmonary vascularity are normal. IMPRESSION: Shallow inspiration with linear atelectasis in the lung bases similar to previous study. No evidence of active pulmonary disease. Electronically Signed   By: Burman Nieves M.D.   On: 06/17/2017 00:34    Procedures Procedures (including critical care time)   EMERGENCY  DEPARTMENT BILIARY ULTRASOUND INTERPRETATION "Study: Limited Abdominal Ultrasound of the Gallbladder and Common Bile Duct."  INDICATIONS: Abdominal pain and Nausea and vomiting Indication: Multiple views of the gallbladder and common bile duct were obtained in real-time with a Multi-frequency probe."  PERFORMED BY:  Myself IMAGES ARCHIVED?: Yes LIMITATIONS: Body habitus INTERPRETATION: Cholelithiasis - possible biliary sludge No fluid in the hepatorenal recess  Medications Ordered in ED Medications  lactated ringers bolus 1,000 mL (0 mLs Intravenous Stopped 06/17/17 0200)  ondansetron (ZOFRAN) injection 4 mg (4 mg Intravenous Given 06/17/17 0000)  acetaminophen (TYLENOL) tablet 650 mg (650 mg Oral Given 06/17/17 0037)  cefTRIAXone (ROCEPHIN) 1 g in dextrose 5 % 50 mL IVPB (0 g Intravenous Stopped 06/17/17 0200)  morphine 4 MG/ML injection 4 mg (4 mg Intravenous Given 06/17/17 0323)  morphine 4 MG/ML injection 4 mg (4 mg Intravenous Given 06/17/17 0601)     Initial Impression / Assessment and Plan / ED Course  I have reviewed the triage vital signs and the nursing notes.  Pertinent labs & imaging results that were available during my care of the patient were reviewed by me and considered in my medical decision making (see chart for details).  Clinical Course as of Jun 18 811  Caleen EssexFri Jun 17, 2017  0123 I performed a bedside ultrasound. Questionable gallstones appreciated.  No gallbladder wall thickening or pericholecystic fluid appreciated.  I suspect patient might be having symptomatic cholelithiasis.  Other lab results show clean urine, normal electrolytes, normal LFTs and lipase.  Patient has elevated white count. The ultrasound study was slightly compromised, likely due to body habitus.  Have advised patient that she stay in the ER until morning and get the ultrasound completed formally.  Unfortunately patient reports having history of anxiety and would not want to spend the entire night in the emergency room.  She would prefer coming back in the morning to get a formal study.  I informed her that her white count is elevated, and she is pregnant, and that my ultrasound study was limited therefore her decision should not be based on ultrasound findings I had.  Additionally I informed her the value in watching her closely overnight.  We will give IV Rocephin right now.  Patient and her boyfriend discussing their options.  I have advocated for staying in the ER and getting the formal ultrasound in the morning.  [AN]  0132 Repeat abd exam reveals RUQ tenderness, neg Murphy's. Pt still has non peritoneal abd.   [AN]  0253 Pt has decided to stay in the ER. Repeat abd exam us unchanged. Pain at 8/10.  Plan is to order AM US abdomen and pelvis complete. MRI can be added if the workup is neg and there is concerns for appendicitis.  [AN]  0319 Repeat abd exam is unchanged.  [AN]  [id number] Ultrasound results pending. Signing carried out to the incoming team. Repeat abd exam is uncahnged.  [AN]    Clinical Course User Index [AN] Derwood KaplanNanavati, Emauri Krygier, MD    21 year old G1 P0 woman is about [redacted] weeks pregnant comes in with chief complaint of nausea and wet emesis.  Patient also reports right-sided flank pain.  History is negative for any UTI-like symptoms, however pyelonephritis  is still in her differential diagnosis.  Urinalysis ordered.  Patient also complains of mild cough, chest x-ray ordered to ensure there is no pneumonia.  Patient denies any pleuritic component to her pain, shortness of breath therefore PE considered in the differential but less likely.  Finally patient really does not have lower abdominal pain, therefore ovarian torsion is less likely. Also, US from September had confirmed IUP therefore ectopic ruled out.  Given the patient's pain is in the right flank region cholelithiasis is still possible.  Bedside ultrasound will be completed.  Other consideration would include appendicitis, ovarian torsion.  Final Clinical Impressions(s) / ED Diagnoses   Final diagnoses:  Abdominal pain    ED Discharge Orders    None       Derwood KaplanNanavati, Vaughn Frieze, MD 06/17/17 [id number]    Derwood KaplanNanavati, Sidni Fusco, MD 06/17/17 [id number]    Derwood KaplanNanavati, Terel Bann, MD 06/17/17 [phone]

## 2017-06-17 ENCOUNTER — Emergency Department (HOSPITAL_COMMUNITY): Payer: Medicaid Other

## 2017-06-17 ENCOUNTER — Encounter (HOSPITAL_COMMUNITY): Payer: Self-pay

## 2017-06-17 ENCOUNTER — Other Ambulatory Visit: Payer: Self-pay

## 2017-06-17 DIAGNOSIS — O99282 Endocrine, nutritional and metabolic diseases complicating pregnancy, second trimester: Secondary | ICD-10-CM | POA: Diagnosis present

## 2017-06-17 DIAGNOSIS — F329 Major depressive disorder, single episode, unspecified: Secondary | ICD-10-CM | POA: Diagnosis present

## 2017-06-17 DIAGNOSIS — Z87891 Personal history of nicotine dependence: Secondary | ICD-10-CM | POA: Diagnosis not present

## 2017-06-17 DIAGNOSIS — E2749 Other adrenocortical insufficiency: Secondary | ICD-10-CM | POA: Diagnosis present

## 2017-06-17 DIAGNOSIS — O99612 Diseases of the digestive system complicating pregnancy, second trimester: Secondary | ICD-10-CM | POA: Diagnosis present

## 2017-06-17 DIAGNOSIS — R58 Hemorrhage, not elsewhere classified: Secondary | ICD-10-CM | POA: Diagnosis present

## 2017-06-17 DIAGNOSIS — F1290 Cannabis use, unspecified, uncomplicated: Secondary | ICD-10-CM | POA: Diagnosis present

## 2017-06-17 DIAGNOSIS — O9989 Other specified diseases and conditions complicating pregnancy, childbirth and the puerperium: Secondary | ICD-10-CM | POA: Diagnosis not present

## 2017-06-17 DIAGNOSIS — O99322 Drug use complicating pregnancy, second trimester: Secondary | ICD-10-CM | POA: Diagnosis present

## 2017-06-17 DIAGNOSIS — O99342 Other mental disorders complicating pregnancy, second trimester: Secondary | ICD-10-CM | POA: Diagnosis present

## 2017-06-17 DIAGNOSIS — K219 Gastro-esophageal reflux disease without esophagitis: Secondary | ICD-10-CM | POA: Diagnosis present

## 2017-06-17 DIAGNOSIS — Z3A18 18 weeks gestation of pregnancy: Secondary | ICD-10-CM | POA: Diagnosis not present

## 2017-06-17 LAB — RAPID URINE DRUG SCREEN, HOSP PERFORMED
AMPHETAMINES: NOT DETECTED
BENZODIAZEPINES: NOT DETECTED
Barbiturates: NOT DETECTED
COCAINE: NOT DETECTED
OPIATES: POSITIVE — AB
Tetrahydrocannabinol: POSITIVE — AB

## 2017-06-17 LAB — CBC
HCT: 34.8 % — ABNORMAL LOW (ref 36.0–46.0)
HEMATOCRIT: 34.2 % — AB (ref 36.0–46.0)
HEMOGLOBIN: 11.6 g/dL — AB (ref 12.0–15.0)
Hemoglobin: 11.7 g/dL — ABNORMAL LOW (ref 12.0–15.0)
MCH: 30.2 pg (ref 26.0–34.0)
MCH: 30.1 pg (ref 26.0–34.0)
MCHC: 33.3 g/dL (ref 30.0–36.0)
MCHC: 34.2 g/dL (ref 30.0–36.0)
MCV: 90.6 fL (ref 78.0–100.0)
MCV: 87.9 fL (ref 78.0–100.0)
PLATELETS: 335 10*3/uL (ref 150–400)
Platelets: 342 10*3/uL (ref 150–400)
RBC: 3.84 MIL/uL — AB (ref 3.87–5.11)
RBC: 3.89 MIL/uL (ref 3.87–5.11)
RDW: 14 % (ref 11.5–15.5)
RDW: 14.3 % (ref 11.5–15.5)
WBC: 26.8 10*3/uL — ABNORMAL HIGH (ref 4.0–10.5)
WBC: 27.2 10*3/uL — ABNORMAL HIGH (ref 4.0–10.5)

## 2017-06-17 LAB — TYPE AND SCREEN
ABO/RH(D): O POS
Antibody Screen: NEGATIVE

## 2017-06-17 LAB — URINALYSIS, ROUTINE W REFLEX MICROSCOPIC
Bilirubin Urine: NEGATIVE
GLUCOSE, UA: NEGATIVE mg/dL
Hgb urine dipstick: NEGATIVE
Ketones, ur: 5 mg/dL — AB
LEUKOCYTES UA: NEGATIVE
NITRITE: NEGATIVE
PH: 6 (ref 5.0–8.0)
PROTEIN: NEGATIVE mg/dL
Specific Gravity, Urine: 1.015 (ref 1.005–1.030)

## 2017-06-17 MED ORDER — PRENATAL MULTIVITAMIN CH
1.0000 | ORAL_TABLET | Freq: Every day | ORAL | Status: DC
  Filled 2017-06-17: qty 1

## 2017-06-17 MED ORDER — KETOROLAC TROMETHAMINE 30 MG/ML IJ SOLN
30.0000 mg | Freq: Three times a day (TID) | INTRAMUSCULAR | Status: AC
  Administered 2017-06-17 – 2017-06-18 (×2): 30 mg via INTRAVENOUS
  Filled 2017-06-17 (×3): qty 1

## 2017-06-17 MED ORDER — MORPHINE SULFATE (PF) 4 MG/ML IV SOLN
4.0000 mg | Freq: Once | INTRAVENOUS | Status: AC
  Administered 2017-06-17: 4 mg via INTRAVENOUS
  Filled 2017-06-17: qty 1

## 2017-06-17 MED ORDER — LACTATED RINGERS IV SOLN
INTRAVENOUS | Status: DC
  Administered 2017-06-17 (×2): via INTRAVENOUS

## 2017-06-17 MED ORDER — HYDROCODONE-ACETAMINOPHEN 5-325 MG PO TABS
1.0000 | ORAL_TABLET | ORAL | Status: DC | PRN
  Administered 2017-06-17: 2 via ORAL
  Filled 2017-06-17: qty 2

## 2017-06-17 MED ORDER — DEXTROSE 5 % IV SOLN
1.0000 g | Freq: Once | INTRAVENOUS | Status: AC
  Administered 2017-06-17: 1 g via INTRAVENOUS
  Filled 2017-06-17: qty 10

## 2017-06-17 MED ORDER — SODIUM CHLORIDE 0.9 % IV BOLUS (SEPSIS)
1000.0000 mL | Freq: Once | INTRAVENOUS | Status: AC
  Administered 2017-06-17: 1000 mL via INTRAVENOUS

## 2017-06-17 MED ORDER — ZOLPIDEM TARTRATE 5 MG PO TABS
5.0000 mg | ORAL_TABLET | Freq: Every evening | ORAL | Status: DC | PRN
  Administered 2017-06-17: 5 mg via ORAL
  Filled 2017-06-17: qty 1

## 2017-06-17 MED ORDER — ACETAMINOPHEN 325 MG PO TABS: 650.0000 mg | ORAL_TABLET | ORAL | Status: DC | PRN

## 2017-06-17 MED ORDER — IOPAMIDOL (ISOVUE-300) INJECTION 61%
100.0000 mL | Freq: Once | INTRAVENOUS | Status: AC | PRN
  Administered 2017-06-17: 100 mL via INTRAVENOUS

## 2017-06-17 MED ORDER — ACETAMINOPHEN 500 MG PO TABS
1000.0000 mg | ORAL_TABLET | Freq: Once | ORAL | Status: AC
  Administered 2017-06-17: 1000 mg via ORAL
  Filled 2017-06-17: qty 2

## 2017-06-17 MED ORDER — ONDANSETRON HCL 4 MG/2ML IJ SOLN
4.0000 mg | Freq: Once | INTRAMUSCULAR | Status: AC
  Administered 2017-06-17: 4 mg via INTRAVENOUS
  Filled 2017-06-17: qty 2

## 2017-06-17 MED ORDER — CALCIUM CARBONATE ANTACID 500 MG PO CHEW: 2.0000 | CHEWABLE_TABLET | ORAL | Status: DC | PRN

## 2017-06-17 MED ORDER — DOCUSATE SODIUM 100 MG PO CAPS
100.0000 mg | ORAL_CAPSULE | Freq: Every day | ORAL | Status: DC
  Administered 2017-06-18 – 2017-06-19 (×2): 100 mg via ORAL
  Filled 2017-06-17 (×3): qty 1

## 2017-06-17 MED ORDER — PROMETHAZINE HCL 25 MG/ML IJ SOLN
25.0000 mg | Freq: Four times a day (QID) | INTRAMUSCULAR | Status: DC | PRN
  Administered 2017-06-17 – 2017-06-18 (×2): 25 mg via INTRAVENOUS
  Filled 2017-06-17 (×2): qty 1

## 2017-06-17 MED ORDER — HYDROMORPHONE HCL 1 MG/ML IJ SOLN
0.5000 mg | INTRAMUSCULAR | Status: DC | PRN
  Administered 2017-06-17 (×2): 1 mg via INTRAVENOUS
  Filled 2017-06-17 (×2): qty 1

## 2017-06-17 NOTE — ED Notes (Signed)
Pt returned from US

## 2017-06-17 NOTE — ED Notes (Signed)
Pt requesting pain medicine and ice chips. This RN will notify EDP.

## 2017-06-17 NOTE — ED Notes (Signed)
Pt left at 1410 and directed to go straight to [facility] and advise staff she is a direct admit

## 2017-06-17 NOTE — ED Notes (Signed)
Pt called out asking for something for pain. RN notified.

## 2017-06-17 NOTE — ED Notes (Signed)
Dr. Despina HiddenEure repaged to Dr. Hyacinth MeekerMiller at [phone].

## 2017-06-17 NOTE — ED Notes (Signed)
Pt sleeping. 

## 2017-06-17 NOTE — ED Notes (Signed)
Pt ambulated to restroom & returned to room w/ no complications. 

## 2017-06-17 NOTE — ED Notes (Signed)
edp aware pt requesting pain med and drink. Advised pt will have to wait until US back.

## 2017-06-17 NOTE — ED Provider Notes (Signed)
I have examined the patient, she appears uncomfortable and colicky rocking on the bed.  I have reviewed her labs which showed a leukocytosis of 23,000 but no other significant liver dysfunction renal dysfunction or imaging abnormalities to suggest a cause of her pain.  I discussed her care with the on-call OB/GYN at 9:15 AM, Dr. Despina HiddenEure recommends a CT scan of the abdomen and pelvis to further evaluate for appendicitis as he would consider this something that needs to be ruled out prior to her being discharged.  I will give the patient something to drink because she is becoming increasingly agitated and not being able to have this.  I am less concerned about her having something on her stomach that her willingness to stay and not leave AGAINST MEDICAL ADVICE.\  CT scan has resulted and shows that the patient likely has some adrenal abnormalities on the right with possible bleeding or fluid surrounding that area which is causing some pain.  This is retroperitoneal and likely explaining the patient's flank symptoms.  Her hemoglobin is slightly low but not terrible, her white blood cell count is significantly elevated and on a repeat level had continued to drop.  The patient had poor pain control and has ongoing nausea, she will need to be admitted for symptom out-of-control.  Dr. Despina HiddenEure has graciously offered to admit the patient to the hospital for observation and further evaluation.  The patient refuses to go by ambulance but will allow a family member to drive her.  I had a long discussion with the patient about this as there are bad roads with lots of rain at this time.  She again refuses ambulance transport but will go by family.  Questions answered, patient will go by private vehicle.  Final diagnoses:  Abdominal pain  Adrenal hemorrhage (HCC)  Leukocytosis, unspecified type      Eber HongMiller, Crist Kruszka, MD 06/17/17 1349

## 2017-06-17 NOTE — ED Notes (Signed)
Pt cursing. Family at bedside. Stated she has been waiting an hour an half for pain med. Advised that I was just in at 0835 and unable to have anythinguntil us back. edp aware again

## 2017-06-18 DIAGNOSIS — O9989 Other specified diseases and conditions complicating pregnancy, childbirth and the puerperium: Secondary | ICD-10-CM

## 2017-06-18 DIAGNOSIS — Z3A18 18 weeks gestation of pregnancy: Secondary | ICD-10-CM

## 2017-06-18 DIAGNOSIS — E2749 Other adrenocortical insufficiency: Secondary | ICD-10-CM

## 2017-06-18 LAB — COMPREHENSIVE METABOLIC PANEL
ALBUMIN: 3 g/dL — AB (ref 3.5–5.0)
ALT: 22 U/L (ref 14–54)
ANION GAP: 8 (ref 5–15)
AST: 19 U/L (ref 15–41)
Alkaline Phosphatase: 68 U/L (ref 38–126)
BILIRUBIN TOTAL: 0.3 mg/dL (ref 0.3–1.2)
CHLORIDE: 101 mmol/L (ref 101–111)
CO2: 25 mmol/L (ref 22–32)
Calcium: 8.3 mg/dL — ABNORMAL LOW (ref 8.9–10.3)
Creatinine, Ser: 0.37 mg/dL — ABNORMAL LOW (ref 0.44–1.00)
GFR calc Af Amer: >60 mL/min (ref >60)
Glucose, Bld: 101 mg/dL — ABNORMAL HIGH (ref 65–99)
Potassium: 3.1 mmol/L — ABNORMAL LOW (ref 3.5–5.1)
Sodium: 134 mmol/L — ABNORMAL LOW (ref 135–145)
TOTAL PROTEIN: 6.2 g/dL — AB (ref 6.5–8.1)

## 2017-06-18 LAB — CBC WITH DIFFERENTIAL/PLATELET
BASOS ABS: 0 10*3/uL (ref 0.0–0.1)
BASOS PCT: 0 %
EOS PCT: 0 %
Eosinophils Absolute: 0.1 10*3/uL (ref 0.0–0.7)
HCT: 32.2 % — ABNORMAL LOW (ref 36.0–46.0)
Hemoglobin: 11.3 g/dL — ABNORMAL LOW (ref 12.0–15.0)
Lymphocytes Relative: 10 %
Lymphs Abs: 1.8 10*3/uL (ref 0.7–4.0)
MCH: 30.9 pg (ref 26.0–34.0)
MCHC: 35.1 g/dL (ref 30.0–36.0)
MCV: 88 fL (ref 78.0–100.0)
MONO ABS: 1.2 10*3/uL — AB (ref 0.1–1.0)
MONOS PCT: 6 %
Neutro Abs: 16.3 10*3/uL — ABNORMAL HIGH (ref 1.7–7.7)
Neutrophils Relative %: 84 %
PLATELETS: 280 10*3/uL (ref 150–400)
RBC: 3.66 MIL/uL — ABNORMAL LOW (ref 3.87–5.11)
RDW: 14.4 % (ref 11.5–15.5)
WBC: 19.3 10*3/uL — ABNORMAL HIGH (ref 4.0–10.5)

## 2017-06-18 LAB — ABO/RH: ABO/RH(D): O POS

## 2017-06-18 MED ORDER — ONDANSETRON HCL 4 MG PO TABS
4.0000 mg | ORAL_TABLET | ORAL | Status: DC | PRN
  Administered 2017-06-18 (×2): 4 mg via ORAL
  Filled 2017-06-18 (×2): qty 1

## 2017-06-18 MED ORDER — OXYCODONE-ACETAMINOPHEN 5-325 MG PO TABS
1.0000 | ORAL_TABLET | ORAL | Status: DC | PRN
  Administered 2017-06-18: 1 via ORAL
  Administered 2017-06-18: 2 via ORAL
  Administered 2017-06-19: 1 via ORAL
  Filled 2017-06-18: qty 2
  Filled 2017-06-18 (×2): qty 1

## 2017-06-18 MED ORDER — SCOPOLAMINE 1 MG/3DAYS TD PT72
1.0000 | MEDICATED_PATCH | TRANSDERMAL | Status: DC
  Administered 2017-06-18: 1.5 mg via TRANSDERMAL
  Filled 2017-06-18: qty 1

## 2017-06-18 MED ORDER — KETOROLAC TROMETHAMINE 60 MG/2ML IM SOLN
30.0000 mg | Freq: Four times a day (QID) | INTRAMUSCULAR | Status: DC
  Administered 2017-06-18: 30 mg via INTRAMUSCULAR
  Filled 2017-06-18 (×4): qty 2

## 2017-06-18 MED ORDER — PROMETHAZINE HCL 25 MG/ML IJ SOLN: 25.0000 mg | Freq: Four times a day (QID) | INTRAMUSCULAR | Status: DC | PRN

## 2017-06-18 MED ORDER — HYDROMORPHONE HCL 1 MG/ML IJ SOLN: 0.5000 mg | INTRAMUSCULAR | Status: DC | PRN

## 2017-06-18 MED ORDER — PROMETHAZINE HCL 25 MG PO TABS
25.0000 mg | ORAL_TABLET | Freq: Four times a day (QID) | ORAL | Status: DC | PRN
  Administered 2017-06-18 – 2017-06-19 (×3): 25 mg via ORAL
  Filled 2017-06-18 (×4): qty 1

## 2017-06-18 NOTE — Progress Notes (Signed)
Patient ID: Tiffany [name], female   DOB: 12/15/1995, 21 y.o.   MRN: [id number] FACULTY PRACTICE ANTEPARTUM(COMPREHENSIVE) NOTE  Tiffany [name] is a 21 y.o. G1P0 with Estimated Date of Delivery: 11/19/17   By  Early sonogram [id number]  who is admitted for observation due to retroperitoneal hemorrhage from  right adrenal gland.    Pt feels much better this am and essentially says her pain is gone although on exam she still hurts with palpation of her right flank  Nausea has also resolved with the IV phenergan  Fetal presentation is unsure. Length of Stay:  1  Days  Date of admission:06/16/2017  Subjective: Pt feels much better this am and essentially says her pain is gone although on exam she still hurts with palpation of her right flank  Nausea has also resolved with the IV phenergan  Patient reports the fetal movement as present. Patient reports uterine contraction  activity as none. Patient reports  vaginal bleeding as none. Patient describes fluid per vagina as None.  Vitals:  Blood pressure (!) 106/55, pulse 69, temperature 98.9 F (37.2 C), temperature source Oral, resp. rate 18, height 5' 2.5" (1.588 m), weight 165 lb (74.8 kg), last menstrual period 02/27/2017, SpO2 99 %. Vitals:   06/17/17 1600 06/17/17 2001 06/17/17 2333 06/18/17 0500  BP: (!) 119/59 130/68 (!) 126/59 (!) 106/55  Pulse: 98 62 75 69  Resp: 17 16 18 18   Temp: 98.2 F (36.8 C) 98.4 F (36.9 C) 98.4 F (36.9 C) 98.9 F (37.2 C)  TempSrc: Oral Oral Oral Oral  SpO2: 98% 100% 100% 99%  Weight:      Height:       Physical Examination:  General appearance - sleeping but wakes easily and is comfortable Abdomen -gravid soft, nontender, nondistended, no masses or organomegaly Back exam - right flank tender with palpation Fundal Height:  size equals dates Pelvic Exam:  deferred  Extremities: extremities normal, atraumatic, no cyanosis or edema with DTRs 2+ bilaterally Membranes:intact  Fetal Monitoring:      +FHT  Labs:  CBC Latest Ref Rng & Units 06/18/2017 06/17/2017 06/17/2017  WBC 4.0 - 10.5 K/uL 19.3(H) 27.2(H) 26.8(H)  Hemoglobin 12.0 - 15.0 g/dL 11.3(L) 11.7(L) 11.6(L)  Hematocrit 36.0 - 46.0 % 32.2(L) 34.2(L) 34.8(L)  Platelets 150 - 400 K/uL 280 335 342   CMP Latest Ref Rng & Units 06/18/2017 06/16/2017  Glucose 65 - 99 mg/dL 914(N101(H) 829(F136(H)  BUN 6 - 20 mg/dL <6(O<5(L) 5(L)  Creatinine 0.44 - 1.00 mg/dL 1.30(Q0.37(L) 6.570.54  Sodium 846135 - 145 mmol/L 134(L) 137  Potassium 3.5 - 5.1 mmol/L 3.1(L) 3.4(L)  Chloride 101 - 111 mmol/L 101 104  CO2 22 - 32 mmol/L 25 23  Calcium 8.9 - 10.3 mg/dL 8.3(L) 9.1  Total Protein 6.5 - 8.1 g/dL 6.2(L) 6.5  Total Bilirubin 0.3 - 1.2 mg/dL 0.3 0.4  Alkaline Phos 38 - 126 U/L 68 62  AST 15 - 41 U/L 19 26  ALT 14 - 54 U/L 22 30    Imaging Studies:    No new studies  Medications:  Scheduled . docusate sodium  100 mg Oral Daily  . ketorolac  30 mg Intravenous Q8H  . prenatal multivitamin  1 tablet Oral Q1200   I have reviewed the patient's current medications.  ASSESSMENT: G1P0 1087w0d Estimated Date of Delivery: 11/19/17  Retroperitoneal hemorrhage due to bleeding from her right adrenal gland: status improved Leukocytosis, improving, does not appear to be infectious etiology, probably a stress  response and demargination related to adrenal gland  Patient Active Problem List   Diagnosis Date Noted  . 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 Supervision of normal first pregnancy 05/11/2017  . Marijuana use 05/11/2017  . Gastroesophageal reflux disease without esophagitis 03/07/2017  . Depression 03/07/2017  . Intrinsic eczema 03/07/2017    PLAN: Transition to oral pain meds today and oral nausea medicine and see if pt tolerates In house observation today Recheck CBC in am and if stable discharge home  EURE,LUTHER H 06/18/2017,6:50 AM

## 2017-06-18 NOTE — H&P (Signed)
Preoperative History and Physical  Tiffany [name] is a 21 y.o. G1P0 [redacted]w[redacted]d Estimated Date of Delivery: 11/19/17  with Patient's last menstrual period was 02/27/2017 (approximate). admitted for a observation related to a right retroperitoneal hemorrhage from the right adrenal gland.  Pt began having pain 36 hours prior to presentation to the [facility] ED. Prior to that she has had no problems at all with the pregnancy except usual nausea vomiting of pregnancy Work up was interesting for leukocytosis and right side/flank pain, not consistent with pyelonephritis and a completely clean urine.  I recommended a CT scan to rule out unusual appendicitis in pregnancy and found a right sided retroperitoneal hemorrhage originating from the right adrenal gland.  It is unclear if there was a cyst or other pathology associated prior but that is suspected.  The leukocytosis is felt to be a stress response demargination due to adrenal gland involvement.  The left adrenal gland is normal.  LFTs are normal include pancreatic enzymes and the urine is clear  On evaluation the patient is in a great deal of pain and having continuous wretching  There is no other pathology noted.  To this point the pregnancy has otherwise been unremarkable    PMH:    Past Medical History:  Diagnosis Date  . Anxiety   . Asthma   . Bronchitis in child   . Depression   . Eczema     PSH:     Past Surgical History:  Procedure Laterality Date  . WISDOM TOOTH EXTRACTION      POb/GynH:      OB History    Gravida Para Term Preterm AB Living   1             SAB TAB Ectopic Multiple Live Births                  SH:   Social History   Tobacco Use  . Smoking status: Former Smoker    Years: 1.00    Types: Cigarettes  . Smokeless tobacco: Never Used  Substance Use Topics  . Alcohol use: No  . Drug use: Yes    Types: Marijuana    Comment: daily;"helps with nausea"    FH:    Family History  Problem Relation Age of  Onset  . COPD Mother   . Diabetes Mother        pre-diabetes  . Other Mother        boils  . COPD Paternal Grandfather   . Diabetes Paternal Grandmother   . COPD Maternal Grandmother   . Thyroid disease Maternal Grandmother   . Aneurysm Maternal Grandfather   . Thyroid disease Maternal Aunt      Allergies: No Known Allergies  Medications:       Current Facility-Administered Medications:  .  acetaminophen (TYLENOL) tablet 650 mg, 650 mg, Oral, Q4H PRN, Lesly Dukes, MD .  calcium carbonate (TUMS - dosed in mg elemental calcium) chewable tablet 400 mg of elemental calcium, 2 tablet, Oral, Q4H PRN, Lesly Dukes, MD .  docusate sodium (COLACE) capsule 100 mg, 100 mg, Oral, Daily, Lesly Dukes, MD .  HYDROmorphone (DILAUDID) injection 0.5-1 mg, 0.5-1 mg, Intravenous, Q4H PRN, Lazaro Arms, MD, 1 mg at 06/17/17 2253 .  ketorolac (TORADOL) 30 MG/ML injection 30 mg, 30 mg, Intravenous, Q8H, Lazaro Arms, MD, 30 mg at 06/18/17 0357 .  lactated ringers infusion, , Intravenous, Continuous, Lesly Dukes, MD, Last Rate: 125  mL/hr at 06/17/17 2257 .  prenatal multivitamin tablet 1 tablet, 1 tablet, Oral, Q1200, Lesly DukesLeggett, Kelly H, MD .  promethazine (PHENERGAN) injection 25 mg, 25 mg, Intravenous, Q6H PRN, Lazaro ArmsEure, Luther H, MD, 25 mg at 06/18/17 0240 .  zolpidem (AMBIEN) tablet 5 mg, 5 mg, Oral, QHS PRN, Lesly DukesLeggett, Kelly H, MD, 5 mg at 06/17/17 2253  Review of Systems:   Review of Systems  Constitutional: Negative for fever, chills, weight loss, malaise/fatigue and diaphoresis.  HENT: Negative for hearing loss, ear pain, nosebleeds, congestion, sore throat, neck pain, tinnitus and ear discharge.   Eyes: Negative for blurred vision, double vision, photophobia, pain, discharge and redness.  Respiratory: Negative for cough, hemoptysis, sputum production, shortness of breath, wheezing and stridor.   Cardiovascular: Negative for chest pain, palpitations, orthopnea, claudication, leg  swelling and PND.  Gastrointestinal: Positive for right flank/side pain. Negative for heartburn, nausea, vomiting, diarrhea, constipation, blood in stool and melena.  Genitourinary: Negative for dysuria, urgency, frequency, hematuria and flank pain.  Musculoskeletal: Negative for myalgias, back pain, joint pain and falls.  Skin: Negative for itching and rash.  Neurological: Negative for dizziness, tingling, tremors, sensory change, speech change, focal weakness, seizures, loss of consciousness, weakness and headaches.  Endo/Heme/Allergies: Negative for environmental allergies and polydipsia. Does not bruise/bleed easily.  Psychiatric/Behavioral: Negative for depression, suicidal ideas, hallucinations, memory loss and substance abuse. The patient is not nervous/anxious and does not have insomnia.      PHYSICAL EXAM:  Blood pressure (!) 106/55, pulse 69, temperature 98.9 F (37.2 C), temperature source Oral, resp. rate 18, height 5' 2.5" (1.588 m), weight 165 lb (74.8 kg), last menstrual period 02/27/2017, SpO2 99 %.    Vitals reviewed. Constitutional: She is oriented to person, place, and time. She appears well-developed and well-nourished.  HENT:  Head: Normocephalic and atraumatic.  Right Ear: External ear normal.  Left Ear: External ear normal.  Nose: Nose normal.  Mouth/Throat: Oropharynx is clear and moist.  Eyes: Conjunctivae and EOM are normal. Pupils are equal, round, and reactive to light. Right eye exhibits no discharge. Left eye exhibits no discharge. No scleral icterus.  Neck: Normal range of motion. Neck supple. No tracheal deviation present. No thyromegaly present.  Cardiovascular: Normal rate, regular rhythm, normal heart sounds and intact distal pulses.  Exam reveals no gallop and no friction rub.   No murmur heard. Respiratory: Effort normal and breath sounds normal. No respiratory distress. She has no wheezes. She has no rales. She exhibits no tenderness.  GI: Soft.  Bowel sounds are normal. She exhibits no distension and no mass. There is tenderness on the high right flank, not CVAT and hursts just with palpation There is no rebound and no guarding.  Genitourinary:       Vulva is normal without lesions Vagina is pink moist without discharge Cervix normal in appearance and pap is normal Uterus is gravid 18 weeks size Adnexa is negative with normal sized ovaries by sonogram  Musculoskeletal: Normal range of motion. She exhibits no edema and no tenderness.  Neurological: She is alert and oriented to person, place, and time. She has normal reflexes. She displays normal reflexes. No cranial nerve deficit. She exhibits normal muscle tone. Coordination normal.  Skin: Skin is warm and dry. No rash noted. No erythema. No pallor.  Psychiatric: She has a normal mood and affect. Her behavior is normal. Judgment and thought content normal.    Labs: Results for orders placed or performed during the hospital encounter of 06/16/17 (from  the past 336 hour(s))  Lipase, blood   Collection Time: 06/16/17 10:30 PM  Result Value Ref Range   Lipase 24 11 - 51 U/L  Comprehensive metabolic panel   Collection Time: 06/16/17 10:30 PM  Result Value Ref Range   Sodium 137 135 - 145 mmol/L   Potassium 3.4 (L) 3.5 - 5.1 mmol/L   Chloride 104 101 - 111 mmol/L   CO2 23 22 - 32 mmol/L   Glucose, Bld 136 (H) 65 - 99 mg/dL   BUN 5 (L) 6 - 20 mg/dL   Creatinine, Ser 4.09 0.44 - 1.00 mg/dL   Calcium 9.1 8.9 - 81.1 mg/dL   Total Protein 6.5 6.5 - 8.1 g/dL   Albumin 3.3 (L) 3.5 - 5.0 g/dL   AST 26 15 - 41 U/L   ALT 30 14 - 54 U/L   Alkaline Phosphatase 62 38 - 126 U/L   Total Bilirubin 0.4 0.3 - 1.2 mg/dL   GFR calc non Af Amer >60 >60 mL/min   GFR calc Af Amer >60 >60 mL/min   Anion gap 10 5 - 15  CBC   Collection Time: 06/16/17 10:30 PM  Result Value Ref Range   WBC 23.2 (H) 4.0 - 10.5 K/uL   RBC 4.04 3.87 - 5.11 MIL/uL   Hemoglobin 12.2 12.0 - 15.0 g/dL   HCT 91.4 78.2  - 95.6 %   MCV 91.6 78.0 - 100.0 fL   MCH 30.2 26.0 - 34.0 pg   MCHC 33.0 30.0 - 36.0 g/dL   RDW 21.3 08.6 - 57.8 %   Platelets 340 150 - 400 K/uL  Urinalysis, Routine w reflex microscopic   Collection Time: 06/17/17 12:20 AM  Result Value Ref Range   Color, Urine YELLOW YELLOW   APPearance CLEAR CLEAR   Specific Gravity, Urine 1.015 1.005 - 1.030   pH 6.0 5.0 - 8.0   Glucose, UA NEGATIVE NEGATIVE mg/dL   Hgb urine dipstick NEGATIVE NEGATIVE   Bilirubin Urine NEGATIVE NEGATIVE   Ketones, ur 5 (A) NEGATIVE mg/dL   Protein, ur NEGATIVE NEGATIVE mg/dL   Nitrite NEGATIVE NEGATIVE   Leukocytes, UA NEGATIVE NEGATIVE  CBC   Collection Time: 06/17/17 12:41 PM  Result Value Ref Range   WBC 26.8 (H) 4.0 - 10.5 K/uL   RBC 3.84 (L) 3.87 - 5.11 MIL/uL   Hemoglobin 11.6 (L) 12.0 - 15.0 g/dL   HCT 46.9 (L) 62.9 - 52.8 %   MCV 90.6 78.0 - 100.0 fL   MCH 30.2 26.0 - 34.0 pg   MCHC 33.3 30.0 - 36.0 g/dL   RDW 41.3 24.4 - 01.0 %   Platelets 342 150 - 400 K/uL  Urine rapid drug screen (hosp performed)not at [facility]   Collection Time: 06/17/17  4:15 PM  Result Value Ref Range   Opiates POSITIVE (A) NONE DETECTED   Cocaine NONE DETECTED NONE DETECTED   Benzodiazepines NONE DETECTED NONE DETECTED   Amphetamines NONE DETECTED NONE DETECTED   Tetrahydrocannabinol POSITIVE (A) NONE DETECTED   Barbiturates NONE DETECTED NONE DETECTED  CBC on admission   Collection Time: 06/17/17  4:25 PM  Result Value Ref Range   WBC 27.2 (H) 4.0 - 10.5 K/uL   RBC 3.89 3.87 - 5.11 MIL/uL   Hemoglobin 11.7 (L) 12.0 - 15.0 g/dL   HCT 27.2 (L) 53.6 - 64.4 %   MCV 87.9 78.0 - 100.0 fL   MCH 30.1 26.0 - 34.0 pg   MCHC 34.2 30.0 - 36.0 g/dL  RDW 14.3 11.5 - 15.5 %   Platelets 335 150 - 400 K/uL  Type and screen [facility] [place]   Collection Time: 06/17/17  4:25 PM  Result Value Ref Range   ABO/RH(D) O POS    Antibody Screen NEG    Sample Expiration 06/20/2017   ABO/Rh   Collection Time:  06/17/17  4:25 PM  Result Value Ref Range   ABO/RH(D) O POS   CBC with Differential/Platelet   Collection Time: 06/18/17  5:07 AM  Result Value Ref Range   WBC 19.3 (H) 4.0 - 10.5 K/uL   RBC 3.66 (L) 3.87 - 5.11 MIL/uL   Hemoglobin 11.3 (L) 12.0 - 15.0 g/dL   HCT 16.1 (L) 09.6 - 04.5 %   MCV 88.0 78.0 - 100.0 fL   MCH 30.9 26.0 - 34.0 pg   MCHC 35.1 30.0 - 36.0 g/dL   RDW 40.9 81.1 - 91.4 %   Platelets 280 150 - 400 K/uL   Neutrophils Relative % 84 %   Neutro Abs 16.3 (H) 1.7 - 7.7 K/uL   Lymphocytes Relative 10 %   Lymphs Abs 1.8 0.7 - 4.0 K/uL   Monocytes Relative 6 %   Monocytes Absolute 1.2 (H) 0.1 - 1.0 K/uL   Eosinophils Relative 0 %   Eosinophils Absolute 0.1 0.0 - 0.7 K/uL   Basophils Relative 0 %   Basophils Absolute 0.0 0.0 - 0.1 K/uL  Comprehensive metabolic panel   Collection Time: 06/18/17  5:07 AM  Result Value Ref Range   Sodium 134 (L) 135 - 145 mmol/L   Potassium 3.1 (L) 3.5 - 5.1 mmol/L   Chloride 101 101 - 111 mmol/L   CO2 25 22 - 32 mmol/L   Glucose, Bld 101 (H) 65 - 99 mg/dL   BUN <5 (L) 6 - 20 mg/dL   Creatinine, Ser 7.82 (L) 0.44 - 1.00 mg/dL   Calcium 8.3 (L) 8.9 - 10.3 mg/dL   Total Protein 6.2 (L) 6.5 - 8.1 g/dL   Albumin 3.0 (L) 3.5 - 5.0 g/dL   AST 19 15 - 41 U/L   ALT 22 14 - 54 U/L   Alkaline Phosphatase 68 38 - 126 U/L   Total Bilirubin 0.3 0.3 - 1.2 mg/dL   GFR calc non Af Amer >60 >60 mL/min   GFR calc Af Amer >60 >60 mL/min   Anion gap 8 5 - 15  Results for orders placed or performed in visit on 06/09/17 (from the past 336 hour(s))  POCT urinalysis dipstick   Collection Time: 06/09/17 10:26 AM  Result Value Ref Range   Color, UA     Clarity, UA     Glucose, UA neg    Bilirubin, UA     Ketones, UA small    Spec Grav, UA  1.010 - 1.025   Blood, UA neg    pH, UA  5.0 - 8.0   Protein, UA trace    Urobilinogen, UA  0.2 or 1.0 E.U./dL   Nitrite, UA neg    Leukocytes, UA Negative Negative  Antibody screen   Collection Time:  06/09/17 10:33 AM  Result Value Ref Range   Antibody Screen Negative Negative  INTEGRATED 2   Collection Time: 06/09/17 10:34 AM  Result Value Ref Range   Results Report    Test Results: *Screen Negative*    First Trimester Sample Comment    Crown Rump Length 72.2 mm   CRL Scan Date:    Sonographer ID#  [id number]    Collected On Date:    Gest. Age on Collection Date 13.0 weeks   Weight 158 lbs   SECOND TRIMESTER SAMPLE Comment    Collected On Date:    Gestational Age 51.4 weeks   Weight 156 lbs   M[age]D 21.7 yr   Race Other    Insulin Dep Diabetes No    Number of Fetuses 1    Nuchal Translucency (NT) 1.5 mm   Nuchal Translucency MoM 0.84    PAPP-A Value 643.8 ng/mL   PAPP-A MoM 0.61    Alpha-Fetoprotein 33.1 ng/mL   AFP MoM 1.04    hCG Value 10.0 IU/mL   hCG MoM 0.33    Estriol, Unconjugated 0.80 ng/mL   uE3 MoM 0.96    DIA Value 168.2 pg/mL   DIA MoM 1.03    Open Spina Bifida Screening Risk:    Down Syndrome Screening Risk:    Down Syndrome Age Risk:    Trisomy 30 Screening Risk:    Trisomy 76 Age Risk:    OSB Interpretation Comment    Down Syndrome Interpretation Comment    Trisomy 42 Interpretation Comment    Comments: Comment    Note: Comment     EKG: Orders placed or performed during the hospital encounter of 06/16/17  . ED EKG  . ED EKG  . EKG 12-Lead  . EKG 12-Lead  . EKG    Imaging Studies: Dg Chest 2 View  Result Date: 06/17/2017 CLINICAL DATA:  Right-sided lower chest pain, cough, nausea, and vomiting. Patient is pregnant and was shielded. EXAM: CHEST  2 VIEW COMPARISON:  10/11/2016 FINDINGS: Shallow inspiration with linear atelectasis in the lung bases. Appearances similar to prior study. No consolidation or edema. No blunting of costophrenic angles. No pneumothorax. Heart size and pulmonary vascularity are normal. IMPRESSION: Shallow inspiration with linear atelectasis in the lung bases similar to previous study. No evidence of active  pulmonary disease. Electronically Signed   By: Burman Nieves M.D.   On: 06/17/2017 00:34   US Ob Comp + 14 Wk  Result Date: 06/17/2017 CLINICAL DATA:  Pelvic pain. EXAM: LIMITED OBSTETRIC ULTRASOUND AN DOPPLER FINDINGS: Number of Fetuses: 1 Heart Rate:  148 bpm Movement: Yes Presentation: Cephalic Placental Location: Anterior Previa: No Amniotic Fluid (Subjective):  Within normal limits. BPD:  2.87cm 18w  6d MATERNAL FINDINGS: Cervix:  Appears closed. Uterus/Adnexae: Normal right ovary. Right ovary measures 4.4 x 3.7 x 3 cm. No right adnexal mass. Left ovary is not visualize. No left adnexal mass is identified. Pulsed Doppler evaluation of right ovary demonstrates normal low-resistance arterial and venous waveforms. Other findings No free fluid. IMPRESSION: 1. Single live intrauterine pregnancy as detailed above. 2. Normal right ovary.  No right ovarian torsion. 3. Left ovary is not visualized.  No left adnexal mass. This exam is performed on an emergent basis and does not comprehensively evaluate fetal size, dating, or anatomy; follow-up complete OB US should be considered if further fetal assessment is warranted. Electronically Signed   By: Elige Ko   On: 06/17/2017 08:49   Ct Abdomen Pelvis W Contrast  Result Date: 06/17/2017 CLINICAL DATA:  Right rib pain and vomiting. EXAM: CT ABDOMEN AND PELVIS WITH CONTRAST TECHNIQUE: Multidetector CT imaging of the abdomen and pelvis was performed using the standard protocol following bolus administration of intravenous contrast. CONTRAST:  ISOVUE-300 IOPAMIDOL (ISOVUE-300) INJECTION 61% COMPARISON:  None. FINDINGS: Lower chest: Right lower lobe atelectasis. Hepatobiliary: No focal liver  abnormality is seen. No gallstones, gallbladder wall thickening, or biliary dilatation. Incidental note of small area of hypoperfusion versus fatty infiltration along the falciform ligament. Pancreas: Unremarkable. No pancreatic ductal dilatation or surrounding  inflammatory changes. Spleen: Normal in size without focal abnormality. Adrenals/Urinary Tract: The right adrenal gland is uniformly enlarged, asymmetric from the left, hypoattenuated and with indistinct margins. There is a small amount of free fluid along the lateral leaflet of the adrenal gland tracking in the right retroperitoneal space. The left adrenal gland and bilateral kidneys are normal. Stomach/Bowel: Stomach is within normal limits. Appendix appears normal. No evidence of bowel wall thickening, distention, or inflammatory changes. Vascular/Lymphatic: No significant vascular findings are present. No enlarged abdominal or pelvic lymph nodes. Reproductive: Gravid uterus.  No adnexal masses seen. Other: No abdominal wall hernia or abnormality. No abdominopelvic ascites. Musculoskeletal: No acute or significant osseous findings. IMPRESSION: Grossly abnormal appearance of the right adrenal gland, which is asymmetrically enlarged, hypoattenuated and with indistinct margins, with small amount of surrounding fluid tracking into the right retroperitoneal space. Differential diagnosis includes infection of the right adrenal gland, necrosis due to embolic infarct, or adrenal hemorrhage. No rib fractures are seen in the visualized portion of the ribs. Right lower lobe atelectasis. Electronically Signed   By: Ted Mcalpine M.D.   On: 06/17/2017 11:47   US Abdomen Limited  Result Date: 06/17/2017 CLINICAL DATA:  21 year old female pregnant in the second trimester with 2 days of right upper quadrant abdominal pain with vomiting. EXAM: ULTRASOUND ABDOMEN LIMITED RIGHT UPPER QUADRANT COMPARISON:  Report of [facility] CT Abdomen and Pelvis 12/25/2015 (no images available). FINDINGS: Gallbladder: No gallstones or wall thickening visualized. No sonographic Murphy sign noted by sonographer. Common bile duct: Diameter: 2 mm , normal Liver: No focal lesion identified. Within normal limits in parenchymal  echogenicity. Portal vein is patent on color Doppler imaging with normal direction of blood flow towards the liver (image 42). Other findings: Negative visible right renal upper pole (image 32), the entire right kidney is not visualized. IMPRESSION: Normal right upper quadrant ultrasound. Electronically Signed   By: Odessa Fleming M.D.   On: 06/17/2017 08:42   US Pelvic Doppler (torsion R/o Or Mass Arterial Flow)  Result Date: 06/17/2017 CLINICAL DATA:  Pelvic pain. EXAM: LIMITED OBSTETRIC ULTRASOUND AN DOPPLER FINDINGS: Number of Fetuses: 1 Heart Rate:  148 bpm Movement: Yes Presentation: Cephalic Placental Location: Anterior Previa: No Amniotic Fluid (Subjective):  Within normal limits. BPD:  2.87cm 18w  6d MATERNAL FINDINGS: Cervix:  Appears closed. Uterus/Adnexae: Normal right ovary. Right ovary measures 4.4 x 3.7 x 3 cm. No right adnexal mass. Left ovary is not visualize. No left adnexal mass is identified. Pulsed Doppler evaluation of right ovary demonstrates normal low-resistance arterial and venous waveforms. Other findings No free fluid. IMPRESSION: 1. Single live intrauterine pregnancy as detailed above. 2. Normal right ovary.  No right ovarian torsion. 3. Left ovary is not visualized.  No left adnexal mass. This exam is performed on an emergent basis and does not comprehensively evaluate fetal size, dating, or anatomy; follow-up complete OB US should be considered if further fetal assessment is warranted. Electronically Signed   By: Elige Ko   On: 06/17/2017 08:49      Assessment: [redacted]w[redacted]d Estimated Date of Delivery: 11/19/17  Right retroperitoneal hemorrhage from the right adrenal gland causing significant pain and emesis/wretching Patient Active Problem List   Diagnosis Date Noted  . 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 Supervision of normal first  pregnancy 05/11/2017  . Marijuana use 05/11/2017  . Gastroesophageal reflux disease without esophagitis 03/07/2017  . Depression 03/07/2017  .  Intrinsic eczema 03/07/2017    Plan: Admit for in house observation, serial CBC and serial exams Pain and nausea management Discussed briefly with Dr Lovell SheehanJenkins general surgery who agrees with conservative plan unless clinical situation changes, no indication for any surgical intervention and he would highly doubt would be necessary  EURE,LUTHER H

## 2017-06-19 ENCOUNTER — Other Ambulatory Visit: Payer: Self-pay | Admitting: Obstetrics & Gynecology

## 2017-06-19 MED ORDER — OXYCODONE-ACETAMINOPHEN 5-325 MG PO TABS: 1.0000 | ORAL_TABLET | ORAL | 0 refills | Status: DC | PRN

## 2017-06-19 MED ORDER — PROMETHAZINE HCL 25 MG PO TABS: 25.0000 mg | ORAL_TABLET | Freq: Four times a day (QID) | ORAL | 0 refills | Status: DC | PRN

## 2017-06-19 MED ORDER — SCOPOLAMINE 1 MG/3DAYS TD PT72: 1.0000 | MEDICATED_PATCH | TRANSDERMAL | 12 refills | Status: DC

## 2017-06-19 NOTE — Discharge Summary (Signed)
Physician Discharge Summary  Patient ID: Tiffany [name] MRN: [id number] DOB/AGE: 1996/07/10 21 y.o.  Admit date: 06/16/2017 Discharge date: 06/19/2017  Admission Diagnoses: Patient Active Problem List   Diagnosis Date Noted  . 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 Supervision of normal first pregnancy 05/11/2017  . Marijuana use 05/11/2017  . Gastroesophageal reflux disease without esophagitis 03/07/2017  . Depression 03/07/2017  . Intrinsic eczema 03/07/2017     Discharge Diagnoses:  Principal Problem:   Adrenal hemorrhage (HCC) Active Problems:   Gastroesophageal reflux disease without esophagitis   Depression   Marijuana use   Discharged Condition: good  Hospital Course: Tiffany MyronSavannah R Stamey is a 21 y.o. G1P0 55104w0d Estimated Date of Delivery: 11/19/17 with Patient's last menstrual period was 02/27/2017 (approximate). admitted for a observation related to a right retroperitoneal hemorrhage from the right adrenal gland. Pt began having pain 36 hours prior to presentation to the [facility]. Prior to that she has had no problems at all with the pregnancy except usual nausea vomiting of pregnancy  Work up was interesting for leukocytosis and right side/flank pain, not consistent with pyelonephritis and a completely clean urine. I recommended a CT scan to rule out unusual appendicitis in pregnancy and found a right sided retroperitoneal hemorrhage originating from the right adrenal gland. It is unclear if there was a cyst or other pathology associated prior but that is suspected. The leukocytosis is felt to be a stress response demargination due to adrenal gland involvement. The left adrenal gland is normal. LFTs are normal include pancreatic enzymes and the urine is clear  On evaluation the patient is in a great deal of pain and having continuous wretching  There is no other pathology noted.  To this point the pregnancy has otherwise been unremarkable  PMH:      Past Medical History:   Diagnosis Date  . Anxiety   . Asthma   . Bronchitis in child   . Depression   . Eczema    PSH:       Past Surgical History:  Procedure Laterality Date  . WISDOM TOOTH EXTRACTION     POb/GynH:          OB History     Gravida Para Term Preterm AB Living   1         SAB TAB Ectopic Multiple Live Births             SH:  Social History        Tobacco Use  . Smoking status: Former Smoker    Years: 1.00    Types: Cigarettes  . Smokeless tobacco: Never Used  Substance Use Topics  . Alcohol use: No  . Drug use: Yes    Types: Marijuana    Comment: daily;"helps with nausea"   FH:       Family History  Problem Relation Age of Onset  . COPD Mother   . Diabetes Mother    pre-diabetes  . Other Mother    boils  . COPD Paternal Grandfather   . Diabetes Paternal Grandmother   . COPD Maternal Grandmother   . Thyroid disease Maternal Grandmother   . Aneurysm Maternal Grandfather   . Thyroid disease Maternal Aunt    Allergies: No Known Allergies  Medications:  Current Facility-Administered Medications:  . acetaminophen (TYLENOL) tablet 650 mg, 650 mg, Oral, Q4H PRN, Lesly DukesLeggett, Kelly H, MD  . calcium carbonate (TUMS - dosed in mg elemental calcium) chewable tablet 400 mg of elemental calcium,  2 tablet, Oral, Q4H PRN, Lesly Dukes, MD  . docusate sodium (COLACE) capsule 100 mg, 100 mg, Oral, Daily, Lesly Dukes, MD  . HYDROmorphone (DILAUDID) injection 0.5-1 mg, 0.5-1 mg, Intravenous, Q4H PRN, Lazaro Arms, MD, 1 mg at 06/17/17 2253  . ketorolac (TORADOL) 30 MG/ML injection 30 mg, 30 mg, Intravenous, Q8H, Lazaro Arms, MD, 30 mg at 06/18/17 0357  . lactated ringers infusion, , Intravenous, Continuous, Lesly Dukes, MD, Last Rate: 125 mL/hr at 06/17/17 2257  . prenatal multivitamin tablet 1 tablet, 1 tablet, Oral, Q1200, Lesly Dukes, MD  . promethazine (PHENERGAN) injection 25 mg, 25 mg, Intravenous, Q6H PRN, Lazaro Arms, MD, 25 mg at 06/18/17 0240   . zolpidem (AMBIEN) tablet 5 mg, 5 mg, Oral, QHS PRN, Lesly Dukes, MD, 5 mg at 06/17/17 2253  Review of Systems:  Review of Systems  Constitutional: Negative for fever, chills, weight loss, malaise/fatigue and diaphoresis.  HENT: Negative for hearing loss, ear pain, nosebleeds, congestion, sore throat, neck pain, tinnitus and ear discharge.  Eyes: Negative for blurred vision, double vision, photophobia, pain, discharge and redness.  Respiratory: Negative for cough, hemoptysis, sputum production, shortness of breath, wheezing and stridor.  Cardiovascular: Negative for chest pain, palpitations, orthopnea, claudication, leg swelling and PND.  Gastrointestinal: Positive for right flank/side pain. Negative for heartburn, nausea, vomiting, diarrhea, constipation, blood in stool and melena.  Genitourinary: Negative for dysuria, urgency, frequency, hematuria and flank pain.  Musculoskeletal: Negative for myalgias, back pain, joint pain and falls.  Skin: Negative for itching and rash.  Neurological: Negative for dizziness, tingling, tremors, sensory change, speech change, focal weakness, seizures, loss of consciousness, weakness and headaches.  Endo/Heme/Allergies: Negative for environmental allergies and polydipsia. Does not bruise/bleed easily.  Psychiatric/Behavioral: Negative for depression, suicidal ideas, hallucinations, memory loss and substance abuse. The patient is not nervous/anxious and does not have insomnia.  PHYSICAL EXAM:  Blood pressure (!) 106/55, pulse 69, temperature 98.9 F (37.2 C), temperature source Oral, resp. rate 18, height 5' 2.5" (1.588 m), weight 165 lb (74.8 kg), last menstrual period 02/27/2017, SpO2 99 %.  Vitals reviewed.  Constitutional: She is oriented to person, place, and time. She appears well-developed and well-nourished.  HENT:  Head: Normocephalic and atraumatic.  Right Ear: External ear normal.  Left Ear: External ear normal.  Nose: Nose normal.   Mouth/Throat: Oropharynx is clear and moist.  Eyes: Conjunctivae and EOM are normal. Pupils are equal, round, and reactive to light. Right eye exhibits no discharge. Left eye exhibits no discharge. No scleral icterus.  Neck: Normal range of motion. Neck supple. No tracheal deviation present. No thyromegaly present.  Cardiovascular: Normal rate, regular rhythm, normal heart sounds and intact distal pulses. Exam reveals no gallop and no friction rub.  No murmur heard.  Respiratory: Effort normal and breath sounds normal. No respiratory distress. She has no wheezes. She has no rales. She exhibits no tenderness.  GI: Soft. Bowel sounds are normal. She exhibits no distension and no mass. There is tenderness on the high right flank, not CVAT and hursts just with palpation There is no rebound and no guarding.  Genitourinary:  Vulva is normal without lesions Vagina is pink moist without discharge Cervix normal in appearance and pap is normal Uterus is gravid 18 weeks size Adnexa is negative with normal sized ovaries by sonogram  Musculoskeletal: Normal range of motion. She exhibits no edema and no tenderness.  Neurological: She is alert and oriented to person, place, and  time. She has normal reflexes. She displays normal reflexes. No cranial nerve deficit. She exhibits normal muscle tone. Coordination normal.  Skin: Skin is warm and dry. No rash noted. No erythema. No pallor.  Psychiatric: She has a normal mood and affect. Her behavior is normal. Judgment and thought content normal.  Labs:       Results for orders placed or performed during the hospital encounter of 06/16/17 (from the past 336 hour(s))  Lipase, blood   Collection Time: 06/16/17 10:30 PM  Result Value Ref Range   Lipase 24 11 - 51 U/L  Comprehensive metabolic panel   Collection Time: 06/16/17 10:30 PM  Result Value Ref Range   Sodium 137 135 - 145 mmol/L   Potassium 3.4 (L) 3.5 - 5.1 mmol/L   Chloride 104 101 - 111 mmol/L    CO2 23 22 - 32 mmol/L   Glucose, Bld 136 (H) 65 - 99 mg/dL   BUN 5 (L) 6 - 20 mg/dL   Creatinine, Ser 1.61 0.44 - 1.00 mg/dL   Calcium 9.1 8.9 - 09.6 mg/dL   Total Protein 6.5 6.5 - 8.1 g/dL   Albumin 3.3 (L) 3.5 - 5.0 g/dL   AST 26 15 - 41 U/L   ALT 30 14 - 54 U/L   Alkaline Phosphatase 62 38 - 126 U/L   Total Bilirubin 0.4 0.3 - 1.2 mg/dL   GFR calc non Af Amer >60 >60 mL/min   GFR calc Af Amer >60 >60 mL/min   Anion gap 10 5 - 15  CBC   Collection Time: 06/16/17 10:30 PM  Result Value Ref Range   WBC 23.2 (H) 4.0 - 10.5 K/uL   RBC 4.04 3.87 - 5.11 MIL/uL   Hemoglobin 12.2 12.0 - 15.0 g/dL   HCT 04.5 40.9 - 81.1 %   MCV 91.6 78.0 - 100.0 fL   MCH 30.2 26.0 - 34.0 pg   MCHC 33.0 30.0 - 36.0 g/dL   RDW 91.4 78.2 - 95.6 %   Platelets 340 150 - 400 K/uL  Urinalysis, Routine w reflex microscopic   Collection Time: 06/17/17 12:20 AM  Result Value Ref Range   Color, Urine YELLOW YELLOW   APPearance CLEAR CLEAR   Specific Gravity, Urine 1.015 1.005 - 1.030   pH 6.0 5.0 - 8.0   Glucose, UA NEGATIVE NEGATIVE mg/dL   Hgb urine dipstick NEGATIVE NEGATIVE   Bilirubin Urine NEGATIVE NEGATIVE   Ketones, ur 5 (A) NEGATIVE mg/dL   Protein, ur NEGATIVE NEGATIVE mg/dL   Nitrite NEGATIVE NEGATIVE   Leukocytes, UA NEGATIVE NEGATIVE  CBC   Collection Time: 06/17/17 12:41 PM  Result Value Ref Range   WBC 26.8 (H) 4.0 - 10.5 K/uL   RBC 3.84 (L) 3.87 - 5.11 MIL/uL   Hemoglobin 11.6 (L) 12.0 - 15.0 g/dL   HCT 21.3 (L) 08.6 - 57.8 %   MCV 90.6 78.0 - 100.0 fL   MCH 30.2 26.0 - 34.0 pg   MCHC 33.3 30.0 - 36.0 g/dL   RDW 46.9 62.9 - 52.8 %   Platelets 342 150 - 400 K/uL  Urine rapid drug screen (hosp performed)not at [facility]   Collection Time: 06/17/17 4:15 PM  Result Value Ref Range   Opiates POSITIVE (A) NONE DETECTED   Cocaine NONE DETECTED NONE DETECTED   Benzodiazepines NONE DETECTED NONE DETECTED   Amphetamines NONE DETECTED NONE DETECTED   Tetrahydrocannabinol POSITIVE (A) NONE  DETECTED   Barbiturates NONE DETECTED NONE DETECTED  CBC on  admission   Collection Time: 06/17/17 4:25 PM  Result Value Ref Range   WBC 27.2 (H) 4.0 - 10.5 K/uL   RBC 3.89 3.87 - 5.11 MIL/uL   Hemoglobin 11.7 (L) 12.0 - 15.0 g/dL   HCT 16.1 (L) 09.6 - 04.5 %   MCV 87.9 78.0 - 100.0 fL   MCH 30.1 26.0 - 34.0 pg   MCHC 34.2 30.0 - 36.0 g/dL   RDW 40.9 81.1 - 91.4 %   Platelets 335 150 - 400 K/uL  Type and screen [facility]   Collection Time: 06/17/17 4:25 PM  Result Value Ref Range   ABO/RH(D) O POS    Antibody Screen NEG    Sample Expiration 06/20/2017   ABO/Rh   Collection Time: 06/17/17 4:25 PM  Result Value Ref Range   ABO/RH(D) O POS   CBC with Differential/Platelet   Collection Time: 06/18/17 5:07 AM  Result Value Ref Range   WBC 19.3 (H) 4.0 - 10.5 K/uL   RBC 3.66 (L) 3.87 - 5.11 MIL/uL   Hemoglobin 11.3 (L) 12.0 - 15.0 g/dL   HCT 78.2 (L) 95.6 - 21.3 %   MCV 88.0 78.0 - 100.0 fL   MCH 30.9 26.0 - 34.0 pg   MCHC 35.1 30.0 - 36.0 g/dL   RDW 08.6 57.8 - 46.9 %   Platelets 280 150 - 400 K/uL   Neutrophils Relative % 84 %   Neutro Abs 16.3 (H) 1.7 - 7.7 K/uL   Lymphocytes Relative 10 %   Lymphs Abs 1.8 0.7 - 4.0 K/uL   Monocytes Relative 6 %   Monocytes Absolute 1.2 (H) 0.1 - 1.0 K/uL   Eosinophils Relative 0 %   Eosinophils Absolute 0.1 0.0 - 0.7 K/uL   Basophils Relative 0 %   Basophils Absolute 0.0 0.0 - 0.1 K/uL  Comprehensive metabolic panel   Collection Time: 06/18/17 5:07 AM  Result Value Ref Range   Sodium 134 (L) 135 - 145 mmol/L   Potassium 3.1 (L) 3.5 - 5.1 mmol/L   Chloride 101 101 - 111 mmol/L   CO2 25 22 - 32 mmol/L   Glucose, Bld 101 (H) 65 - 99 mg/dL   BUN <5 (L) 6 - 20 mg/dL   Creatinine, Ser 6.29 (L) 0.44 - 1.00 mg/dL   Calcium 8.3 (L) 8.9 - 10.3 mg/dL   Total Protein 6.2 (L) 6.5 - 8.1 g/dL   Albumin 3.0 (L) 3.5 - 5.0 g/dL   AST 19 15 - 41 U/L   ALT 22 14 - 54 U/L   Alkaline Phosphatase 68 38 - 126 U/L   Total  Bilirubin 0.3 0.3 - 1.2 mg/dL   GFR calc non Af Amer >60 >60 mL/min   GFR calc Af Amer >60 >60 mL/min   Anion gap 8 5 - 15  Results for orders placed or performed in visit on 06/09/17 (from the past 336 hour(s))  POCT urinalysis dipstick   Collection Time: 06/09/17 10:26 AM  Result Value Ref Range   Color, UA     Clarity, UA     Glucose, UA neg    Bilirubin, UA     Ketones, UA small    Spec Grav, UA  1.010 - 1.025   Blood, UA neg    pH, UA  5.0 - 8.0   Protein, UA trace    Urobilinogen, UA  0.2 or 1.0 E.U./dL   Nitrite, UA neg    Leukocytes, UA Negative Negative  Antibody screen  Collection Time: 06/09/17 10:33 AM  Result Value Ref Range   Antibody Screen Negative Negative  INTEGRATED 2   Collection Time: 06/09/17 10:34 AM  Result Value Ref Range   Results Report    Test Results: *Screen Negative*    First Trimester Sample Comment    Crown Rump Length 72.2 mm   CRL Scan Date:    Sonographer ID# [id number]    Collected On Date:    Gest. Age on Collection Date 13.0 weeks   Weight 158 lbs   SECOND TRIMESTER SAMPLE Comment    Collected On Date:    Gestational Age 30.4 weeks   Weight 156 lbs   M[age]D 21.7 yr   Race Other    Insulin Dep Diabetes No    Number of Fetuses 1    Nuchal Translucency (NT) 1.5 mm   Nuchal Translucency MoM 0.84    PAPP-A Value 643.8 ng/mL   PAPP-A MoM 0.61    Alpha-Fetoprotein 33.1 ng/mL   AFP MoM 1.04    hCG Value 10.0 IU/mL   hCG MoM 0.33    Estriol, Unconjugated 0.80 ng/mL   uE3 MoM 0.96    DIA Value 168.2 pg/mL   DIA MoM 1.03    Open Spina Bifida Screening Risk:    Down Syndrome Screening Risk:    Down Syndrome Age Risk:    Trisomy 7718 Screening Risk:    Trisomy 5618 Age Risk:    OSB Interpretation Comment    Down Syndrome Interpretation Comment    Trisomy 6518 Interpretation Comment    Comments: Comment    Note: Comment    EKG:     Orders placed or performed during the hospital encounter of 06/16/17  . ED EKG  . ED  EKG  . EKG 12-Lead  . EKG 12-Lead  . EKG   Imaging Studies:   Imaging Results     Assessment:  4344w0d Estimated Date of Delivery: 11/19/17  Right retroperitoneal hemorrhage from the right adrenal gland causing significant pain and emesis/wretching      Patient Active Problem List   Diagnosis Date Noted  . 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 Supervision of normal first pregnancy 05/11/2017  . Marijuana use 05/11/2017  . Gastroesophageal reflux disease without esophagitis 03/07/2017  . Depression 03/07/2017  . Intrinsic eczema 03/07/2017   Plan:  Admit for in house observation, serial CBC and serial exams  Pain and nausea management  Discussed briefly with Dr Lovell SheehanJenkins general surgery who agrees with conservative plan unless clinical situation changes, no indication for any surgical intervention and he would highly doubt would be necessary  EURE,LUTHER H   Her pain and flank tenderness improved and she was satisfied with management with oral medication. Consults: general surgery consulted informally by phone  Significant Diagnostic Studies: radiology: CT scan: abdomen and pelvis  Treatments: IV hydration and analgesia: Dilaudid  Discharge Exam: Blood pressure 117/75, pulse 78, temperature 98.7 F (37.1 C), temperature source Oral, resp. rate 18, height 5' 2.5" (1.588 m), weight 74.8 kg (165 lb), last menstrual period 02/27/2017, SpO2 99 %. General appearance: alert, cooperative and no distress GI: gravid, minimal flank tenderness Extremities: extremities normal, atraumatic, no cyanosis or edema  Disposition: 01-Home or Self Care   Allergies as of 06/19/2017   No Known Allergies     Medication List    TAKE these medications   albuterol 108 (90 Base) MCG/ACT inhaler Commonly known as:  PROVENTIL HFA;VENTOLIN HFA Inhale into the lungs every 6 (  six) hours as needed for wheezing or shortness of breath.   Doxylamine-Pyridoxine 10-10 MG Tbec 2 PO qhs; may take 1po in  am and 1po in afternoon prn nausea   escitalopram 10 MG tablet Commonly known as:  LEXAPRO Take 1 tablet (10 mg total) by mouth daily.   flintstones complete 60 MG chewable tablet Take 2 daily   metoCLOPramide 10 MG tablet Commonly known as:  REGLAN Take 1 tablet (10 mg total) by mouth every 6 (six) hours as needed for nausea (nausea/headache).   oxyCODONE-acetaminophen 5-325 MG tablet Commonly known as:  PERCOCET/ROXICET Take 1-2 tablets every 4 (four) hours as needed by mouth for moderate pain.   promethazine 25 MG tablet Commonly known as:  PHENERGAN Take 1 tablet (25 mg total) every 6 (six) hours as needed by mouth for nausea or vomiting.   triamcinolone cream 0.1 % Commonly known as:  KENALOG Apply 1 application topically 2 (two) times daily.      Follow-up Information    [facility] Follow up in 4 day(s).   Specialty:  Obstetrics and Gynecology Contact information: [address] [place] [address] [phone]          Signed: Scheryl Darter 06/19/2017, 7:53 AM

## 2017-06-19 NOTE — Discharge Instructions (Signed)
Second Trimester of Pregnancy The second trimester is from week 13 through week 28, month 4 through 6. This is often the time in pregnancy that you feel your best. Often times, morning sickness has lessened or quit. You may have more energy, and you may get hungry more often. Your unborn baby (fetus) is growing rapidly. At the end of the sixth month, he or she is about 9 inches long and weighs about 1 pounds. You will likely feel the baby move (quickening) between 18 and 20 weeks of pregnancy. Follow these instructions at home:  Avoid all smoking, herbs, and alcohol. Avoid drugs not approved by your doctor.  Do not use any tobacco products, including cigarettes, chewing tobacco, and electronic cigarettes. If you need help quitting, ask your doctor. You may get counseling or other support to help you quit.  Only take medicine as told by your doctor. Some medicines are safe and some are not during pregnancy.  Exercise only as told by your doctor. Stop exercising if you start having cramps.  Eat regular, healthy meals.  Wear a good support bra if your breasts are tender.  Do not use hot tubs, steam rooms, or saunas.  Wear your seat belt when driving.  Avoid raw meat, uncooked cheese, and liter boxes and soil used by cats.  Take your prenatal vitamins.  Take 1500-2000 milligrams of calcium daily starting at the 20th week of pregnancy until you deliver your baby.  Try taking medicine that helps you poop (stool softener) as needed, and if your doctor approves. Eat more fiber by eating fresh fruit, vegetables, and whole grains. Drink enough fluids to keep your pee (urine) clear or pale yellow.  Take warm water baths (sitz baths) to soothe pain or discomfort caused by hemorrhoids. Use hemorrhoid cream if your doctor approves.  If you have puffy, bulging veins (varicose veins), wear support hose. Raise (elevate) your feet for 15 minutes, 3-4 times a day. Limit salt in your diet.  Avoid heavy  lifting, wear low heals, and sit up straight.  Rest with your legs raised if you have leg cramps or low back pain.  Visit your dentist if you have not gone during your pregnancy. Use a soft toothbrush to brush your teeth. Be gentle when you floss.  You can have sex (intercourse) unless your doctor tells you not to.  Go to your doctor visits. Get help if:  You feel dizzy.  You have mild cramps or pressure in your lower belly (abdomen).  You have a nagging pain in your belly area.  You continue to feel sick to your stomach (nauseous), throw up (vomit), or have watery poop (diarrhea).  You have bad smelling fluid coming from your vagina.  You have pain with peeing (urination). Get help right away if:  You have a fever.  You are leaking fluid from your vagina.  You have spotting or bleeding from your vagina.  You have severe belly cramping or pain.  You lose or gain weight rapidly.  You have trouble catching your breath and have chest pain.  You notice sudden or extreme puffiness (swelling) of your face, hands, ankles, feet, or legs.  You have not felt the baby move in over an hour.  You have severe headaches that do not go away with medicine.  You have vision changes. This information is not intended to replace advice given to you by your health care provider. Make sure you discuss any questions you have with your health care   provider. Document Released: 10/20/2009 Document Revised: 01/01/2016 Document Reviewed: 09/26/2012 Else[employer] Interactive Patient Education  2017 [employer]  

## 2017-06-29 ENCOUNTER — Ambulatory Visit (INDEPENDENT_AMBULATORY_CARE_PROVIDER_SITE_OTHER): Payer: Medicaid Other | Admitting: Advanced Practice Midwife

## 2017-06-29 ENCOUNTER — Ambulatory Visit (INDEPENDENT_AMBULATORY_CARE_PROVIDER_SITE_OTHER): Payer: Medicaid Other

## 2017-06-29 ENCOUNTER — Encounter: Payer: Self-pay | Admitting: Advanced Practice Midwife

## 2017-06-29 ENCOUNTER — Other Ambulatory Visit: Payer: Self-pay

## 2017-06-29 VITALS — BP 122/70 | HR 83 | Wt 158.0 lb

## 2017-06-29 DIAGNOSIS — Z3402 Encounter for supervision of normal first pregnancy, second trimester: Secondary | ICD-10-CM

## 2017-06-29 DIAGNOSIS — Z331 Pregnant state, incidental: Secondary | ICD-10-CM

## 2017-06-29 DIAGNOSIS — Z363 Encounter for antenatal screening for malformations: Secondary | ICD-10-CM

## 2017-06-29 DIAGNOSIS — Z3A19 19 weeks gestation of pregnancy: Secondary | ICD-10-CM

## 2017-06-29 DIAGNOSIS — F1290 Cannabis use, unspecified, uncomplicated: Secondary | ICD-10-CM

## 2017-06-29 DIAGNOSIS — Z1389 Encounter for screening for other disorder: Secondary | ICD-10-CM

## 2017-06-29 LAB — POCT URINALYSIS DIPSTICK
GLUCOSE UA: NEGATIVE
KETONES UA: NEGATIVE
Leukocytes, UA: NEGATIVE
Nitrite, UA: NEGATIVE
Protein, UA: NEGATIVE
RBC UA: NEGATIVE

## 2017-06-29 NOTE — Patient Instructions (Signed)
Long[facility] Drug StoresSavannah R [name], I greatly value your feedback.  If you receive a survey following your visit with us today, we appreciate you taking the time to fill it out.  Thanks, Tiffany BeamsFran [name], CNM     Second Trimester of Pregnancy The second trimester is from week 14 through week 27 (months 4 through 6). The second trimester is often a time when you feel your best. Your body has adjusted to being pregnant, and you begin to feel better physically. Usually, morning sickness has lessened or quit completely, you may have more energy, and you may have an increase in appetite. The second trimester is also a time when the fetus is growing rapidly. At the end of the sixth month, the fetus is about 9 inches long and weighs about 1 pounds. You will likely begin to feel the baby move (quickening) between 16 and 20 weeks of pregnancy. Body changes during your second trimester Your body continues to go through many changes during your second trimester. The changes vary from woman to woman.  Your weight will continue to increase. You will notice your lower abdomen bulging out.  You may begin to get stretch marks on your hips, abdomen, and breasts.  You may develop headaches that can be relieved by medicines. The medicines should be approved by your health care provider.  You may urinate more often because the fetus is pressing on your bladder.  You may develop or continue to have heartburn as a result of your pregnancy.  You may develop constipation because certain hormones are causing the muscles that push waste through your intestines to slow down.  You may develop hemorrhoids or swollen, bulging veins (varicose veins).  You may have back pain. This is caused by: ? Weight gain. ? Pregnancy hormones that are relaxing the joints in your pelvis. ? A shift in weight and the muscles that support your balance.  Your breasts will continue to grow and they will continue to become tender.  Your gums may  bleed and may be sensitive to brushing and flossing.  Dark spots or blotches (chloasma, mask of pregnancy) may develop on your face. This will likely fade after the baby is born.  A dark line from your belly button to the pubic area (linea nigra) may appear. This will likely fade after the baby is born.  You may have changes in your hair. These can include thickening of your hair, rapid growth, and changes in texture. Some women also have hair loss during or after pregnancy, or hair that feels dry or thin. Your hair will most likely return to normal after your baby is born.  What to expect at prenatal visits During a routine prenatal visit:  You will be weighed to make sure you and the fetus are growing normally.  Your blood pressure will be taken.  Your abdomen will be measured to track your baby's growth.  The fetal heartbeat will be listened to.  Any test results from the previous visit will be discussed.  Your health care provider may ask you:  How you are feeling.  If you are feeling the baby move.  If you have had any abnormal symptoms, such as leaking fluid, bleeding, severe headaches, or abdominal cramping.  If you are using any tobacco products, including cigarettes, chewing tobacco, and electronic cigarettes.  If you have any questions.  Other tests that may be performed during your second trimester include:  Blood tests that check for: ? Low iron levels (anemia). ?  High blood sugar that affects pregnant women (gestational diabetes) between 20 and 28 weeks. ? Rh antibodies. This is to check for a protein on red blood cells (Rh factor).  Urine tests to check for infections, diabetes, or protein in the urine.  An ultrasound to confirm the proper growth and development of the baby.  An amniocentesis to check for possible genetic problems.  Fetal screens for spina bifida and Down syndrome.  HIV (human immunodeficiency virus) testing. Routine prenatal testing  includes screening for HIV, unless you choose not to have this test.  Follow these instructions at home: Medicines  Follow your health care provider's instructions regarding medicine use. Specific medicines may be either safe or unsafe to take during pregnancy.  Take a prenatal vitamin that contains at least 600 micrograms (mcg) of folic acid.  If you develop constipation, try taking a stool softener if your health care provider approves. Eating and drinking  Eat a balanced diet that includes fresh fruits and vegetables, whole grains, good sources of protein such as meat, eggs, or tofu, and low-fat dairy. Your health care provider will help you determine the amount of weight gain that is right for you.  Avoid raw meat and uncooked cheese. These carry germs that can cause birth defects in the baby.  If you have low calcium intake from food, talk to your health care provider about whether you should take a daily calcium supplement.  Limit foods that are high in fat and processed sugars, such as fried and sweet foods.  To prevent constipation: ? Drink enough fluid to keep your urine clear or pale yellow. ? Eat foods that are high in fiber, such as fresh fruits and vegetables, whole grains, and beans. Activity  Exercise only as directed by your health care provider. Most women can continue their usual exercise routine during pregnancy. Try to exercise for 30 minutes at least 5 days a week. Stop exercising if you experience uterine contractions.  Avoid heavy lifting, wear low heel shoes, and practice good posture.  A sexual relationship may be continued unless your health care provider directs you otherwise. Relieving pain and discomfort  Wear a good support bra to prevent discomfort from breast tenderness.  Take warm sitz baths to soothe any pain or discomfort caused by hemorrhoids. Use hemorrhoid cream if your health care provider approves.  Rest with your legs elevated if you have  leg cramps or low back pain.  If you develop varicose veins, wear support hose. Elevate your feet for 15 minutes, 3-4 times a day. Limit salt in your diet. Prenatal Care  Write down your questions. Take them to your prenatal visits.  Keep all your prenatal visits as told by your health care provider. This is important. Safety  Wear your seat belt at all times when driving.  Make a list of emergency phone numbers, including numbers for family, friends, the hospital, and police and fire departments. General instructions  Ask your health care provider for a referral to a local prenatal education class. Begin classes no later than the beginning of month 6 of your pregnancy.  Ask for help if you have counseling or nutritional needs during pregnancy. Your health care provider can offer advice or refer you to specialists for help with various needs.  Do not use hot tubs, steam rooms, or saunas.  Do not douche or use tampons or scented sanitary pads.  Do not cross your legs for long periods of time.  Avoid cat litter boxes and  soil used by cats. These carry germs that can cause birth defects in the baby and possibly loss of the fetus by miscarriage or stillbirth.  Avoid all smoking, herbs, alcohol, and unprescribed drugs. Chemicals in these products can affect the formation and growth of the baby.  Do not use any products that contain nicotine or tobacco, such as cigarettes and e-cigarettes. If you need help quitting, ask your health care provider.  Visit your dentist if you have not gone yet during your pregnancy. Use a soft toothbrush to brush your teeth and be gentle when you floss. Contact a health care provider if:  You have dizziness.  You have mild pelvic cramps, pelvic pressure, or nagging pain in the abdominal area.  You have persistent nausea, vomiting, or diarrhea.  You have a bad smelling vaginal discharge.  You have pain when you urinate. Get help right away if:  You  have a fever.  You are leaking fluid from your vagina.  You have spotting or bleeding from your vagina.  You have severe abdominal cramping or pain.  You have rapid weight gain or weight loss.  You have shortness of breath with chest pain.  You notice sudden or extreme swelling of your face, hands, ankles, feet, or legs.  You have not felt your baby move in over an hour.  You have severe headaches that do not go away when you take medicine.  You have vision changes. Summary  The second trimester is from week 14 through week 27 (months 4 through 6). It is also a time when the fetus is growing rapidly.  Your body goes through many changes during pregnancy. The changes vary from woman to woman.  Avoid all smoking, herbs, alcohol, and unprescribed drugs. These chemicals affect the formation and growth your baby.  Do not use any tobacco products, such as cigarettes, chewing tobacco, and e-cigarettes. If you need help quitting, ask your health care provider.  Contact your health care provider if you have any questions. Keep all prenatal visits as told by your health care provider. This is important. This information is not intended to replace advice given to you by your health care provider. Make sure you discuss any questions you have with your health care provider.      CHILDBIRTH CLASSES [phone] is the phone number for Pregnancy Classes or hospital tours at [place] will be referred to  [email or web address] for more information on childbirth classes  At this site you may register for classes. You may sign up for a waiting list if classes are full. Please SIGN UP FOR THIS!.   When the waiting list becomes long, sometimes new classes can be added.

## 2017-06-29 NOTE — Progress Notes (Signed)
G1P0 [id number] Estimated Date of Delivery: 11/19/17  Blood pressure 122/70, pulse 83, weight 158 lb (71.7 kg), last menstrual period 02/27/2017.  [facility], feels much much better. No longer vomiting or in pain.   BP weight and urine results all reviewed and noted.  Please refer to the obstetrical flow sheet for the fundal height and fetal heart rate documentation:  US 19+4 wks,cephalic,ant pl gr 0,cx 3.5 cm,svp of fluid 4 cm,normal ovaries bilat,fhr 158 bpm,efw 304 g,anatomy complete no obvious abnormalities     Patient reports good fetal movement, denies any bleeding and no rupture of membranes symptoms or regular contractions. Patient is without complaints. All questions were answered.  Orders Placed This Encounter  Procedures  . POCT urinalysis dipstick    Plan:  Continued routine obstetrical care,   Return in about 4 weeks (around 07/27/2017) for LROB.

## 2017-06-29 NOTE — Progress Notes (Signed)
US 19+4 wks,cephalic,ant pl gr 0,cx 3.5 cm,svp of fluid 4 cm,normal ovaries bilat,fhr 158 bpm,efw 304 g,anatomy complete no obvious abnormalities

## 2017-07-07 ENCOUNTER — Telehealth: Payer: Self-pay | Admitting: Physician Assistant

## 2017-07-07 MED ORDER — IVERMECTIN 0.5 % EX LOTN: TOPICAL_LOTION | CUTANEOUS | 0 refills | Status: DC

## 2017-07-07 MED ORDER — PERMETHRIN 1 % EX LOTN: 1.0000 "application " | TOPICAL_LOTION | Freq: Once | CUTANEOUS | 0 refills | Status: DC

## 2017-07-07 NOTE — Telephone Encounter (Signed)
Please advise if medication will be sent to [facility].

## 2017-07-07 NOTE — Telephone Encounter (Signed)
rx sent to pharmacy

## 2017-07-07 NOTE — Telephone Encounter (Signed)
sklice sent in

## 2017-07-07 NOTE — Telephone Encounter (Signed)
Pt mother states that the permethrin (PERMETHRIN LICE TREATMENT) 1 % lotion [[id number]][[id number]]   that was called in does not work on Tiffany [name] and would like to have sklice called in [facility]

## 2017-07-08 NOTE — Telephone Encounter (Signed)
Left detailed message per dpr  

## 2017-07-26 ENCOUNTER — Telehealth: Payer: Self-pay | Admitting: *Deleted

## 2017-07-26 NOTE — Telephone Encounter (Signed)
Patient called with complaints of a pulling sensation "more to the left side but in the middle of her abdomen" that come and goes.  States she helped her BF move a table yesterday and started to experience the pain afterwards. She is not bleeding or leaking and baby is very active. Informed patient it sounded like round ligament pain but could also be related her moving and lifting a table yesterday. Advised no heavy lifting over 20 pounds, push fluids, tylenol if needed and rest. Also to call our office or go [facility] if she developed bleeding, leaking or cramping. Verbalized understanding.

## 2017-07-27 ENCOUNTER — Encounter: Payer: Medicaid Other | Admitting: Advanced Practice Midwife

## 2017-07-29 ENCOUNTER — Encounter (HOSPITAL_COMMUNITY): Payer: Self-pay

## 2017-07-29 ENCOUNTER — Inpatient Hospital Stay (HOSPITAL_COMMUNITY)
Admission: AD | Admit: 2017-07-29 | Discharge: 2017-07-31 | DRG: 832 | Disposition: A | Discharge status: Home / Self Care | Payer: Medicaid Other | Source: Ambulatory Visit | Attending: Obstetrics and Gynecology | Admitting: Obstetrics and Gynecology

## 2017-07-29 ENCOUNTER — Inpatient Hospital Stay (HOSPITAL_COMMUNITY): Payer: Medicaid Other

## 2017-07-29 ENCOUNTER — Other Ambulatory Visit: Payer: Self-pay

## 2017-07-29 DIAGNOSIS — F1290 Cannabis use, unspecified, uncomplicated: Secondary | ICD-10-CM | POA: Diagnosis present

## 2017-07-29 DIAGNOSIS — Z3A23 23 weeks gestation of pregnancy: Secondary | ICD-10-CM

## 2017-07-29 DIAGNOSIS — M549 Dorsalgia, unspecified: Secondary | ICD-10-CM | POA: Diagnosis present

## 2017-07-29 DIAGNOSIS — O99322 Drug use complicating pregnancy, second trimester: Secondary | ICD-10-CM | POA: Diagnosis present

## 2017-07-29 DIAGNOSIS — Z87891 Personal history of nicotine dependence: Secondary | ICD-10-CM | POA: Diagnosis not present

## 2017-07-29 DIAGNOSIS — O2302 Infections of kidney in pregnancy, second trimester: Secondary | ICD-10-CM | POA: Diagnosis present

## 2017-07-29 LAB — COMPREHENSIVE METABOLIC PANEL
ALT: 12 U/L — AB (ref 14–54)
AST: 16 U/L (ref 15–41)
Albumin: 3.5 g/dL (ref 3.5–5.0)
Alkaline Phosphatase: 118 U/L (ref 38–126)
Anion gap: 17 — ABNORMAL HIGH (ref 5–15)
BUN: 8 mg/dL (ref 6–20)
CHLORIDE: 89 mmol/L — AB (ref 101–111)
CO2: 23 mmol/L (ref 22–32)
CREATININE: 0.68 mg/dL (ref 0.44–1.00)
Calcium: 9.7 mg/dL (ref 8.9–10.3)
GFR calc non Af Amer: >60 mL/min (ref >60)
Glucose, Bld: 74 mg/dL (ref 65–99)
Potassium: 3.4 mmol/L — ABNORMAL LOW (ref 3.5–5.1)
SODIUM: 129 mmol/L — AB (ref 135–145)
Total Bilirubin: 1.1 mg/dL (ref 0.3–1.2)
Total Protein: 7.8 g/dL (ref 6.5–8.1)

## 2017-07-29 LAB — URINALYSIS, ROUTINE W REFLEX MICROSCOPIC
Glucose, UA: NEGATIVE mg/dL
KETONES UR: 80 mg/dL — AB
Nitrite: NEGATIVE
PROTEIN: 100 mg/dL — AB
Specific Gravity, Urine: 1.028 (ref 1.005–1.030)
pH: 5 (ref 5.0–8.0)

## 2017-07-29 LAB — CBC WITH DIFFERENTIAL/PLATELET
BASOS ABS: 0 10*3/uL (ref 0.0–0.1)
BASOS PCT: 0 %
EOS ABS: 0.4 10*3/uL (ref 0.0–0.7)
EOS PCT: 2 %
HCT: 40.9 % (ref 36.0–46.0)
Hemoglobin: 14.1 g/dL (ref 12.0–15.0)
Lymphocytes Relative: 14 %
Lymphs Abs: 2.9 10*3/uL (ref 0.7–4.0)
MCH: 30.6 pg (ref 26.0–34.0)
MCHC: 34.5 g/dL (ref 30.0–36.0)
MCV: 88.7 fL (ref 78.0–100.0)
Monocytes Absolute: 1.2 10*3/uL — ABNORMAL HIGH (ref 0.1–1.0)
Monocytes Relative: 5 %
Neutro Abs: 17.1 10*3/uL — ABNORMAL HIGH (ref 1.7–7.7)
Neutrophils Relative %: 79 %
PLATELETS: 346 10*3/uL (ref 150–400)
RBC: 4.61 MIL/uL (ref 3.87–5.11)
RDW: 14 % (ref 11.5–15.5)
WBC: 21.6 10*3/uL — AB (ref 4.0–10.5)

## 2017-07-29 LAB — TYPE AND SCREEN
ABO/RH(D): O POS
ANTIBODY SCREEN: NEGATIVE

## 2017-07-29 MED ORDER — HYDROMORPHONE HCL 1 MG/ML IJ SOLN
1.0000 mg | Freq: Once | INTRAMUSCULAR | Status: AC
  Administered 2017-07-29: 1 mg via INTRAVENOUS
  Filled 2017-07-29: qty 1

## 2017-07-29 MED ORDER — DOCUSATE SODIUM 100 MG PO CAPS
100.0000 mg | ORAL_CAPSULE | Freq: Every day | ORAL | Status: DC
  Administered 2017-07-30: 100 mg via ORAL
  Filled 2017-07-29 (×4): qty 1

## 2017-07-29 MED ORDER — LACTATED RINGERS IV BOLUS (SEPSIS)
1000.0000 mL | Freq: Once | INTRAVENOUS | Status: AC
  Administered 2017-07-29: 1000 mL via INTRAVENOUS

## 2017-07-29 MED ORDER — ONDANSETRON HCL 4 MG/2ML IJ SOLN
4.0000 mg | Freq: Four times a day (QID) | INTRAMUSCULAR | Status: DC | PRN
  Administered 2017-07-29 – 2017-07-30 (×4): 4 mg via INTRAVENOUS
  Filled 2017-07-29 (×4): qty 2

## 2017-07-29 MED ORDER — ACETAMINOPHEN 325 MG PO TABS: 650.0000 mg | ORAL_TABLET | ORAL | Status: DC | PRN

## 2017-07-29 MED ORDER — ZOLPIDEM TARTRATE 5 MG PO TABS
5.0000 mg | ORAL_TABLET | Freq: Every evening | ORAL | Status: DC | PRN
  Administered 2017-07-31: 5 mg via ORAL
  Filled 2017-07-29: qty 1

## 2017-07-29 MED ORDER — PROMETHAZINE HCL 25 MG/ML IJ SOLN
25.0000 mg | Freq: Four times a day (QID) | INTRAMUSCULAR | Status: DC | PRN
Start: 2017-07-29 — End: 2017-07-31
  Administered 2017-07-29 – 2017-07-30 (×4): 25 mg via INTRAVENOUS
  Filled 2017-07-29 (×4): qty 1

## 2017-07-29 MED ORDER — HYDROMORPHONE HCL 1 MG/ML IJ SOLN
1.0000 mg | INTRAMUSCULAR | Status: DC | PRN
  Administered 2017-07-29 – 2017-07-31 (×12): 1 mg via INTRAVENOUS
  Filled 2017-07-29 (×12): qty 1

## 2017-07-29 MED ORDER — DEXTROSE 5 % IV SOLN
2.0000 g | INTRAVENOUS | Status: DC
  Administered 2017-07-29 – 2017-07-30 (×2): 2 g via INTRAVENOUS
  Filled 2017-07-29 (×3): qty 2

## 2017-07-29 MED ORDER — ONDANSETRON HCL 4 MG/2ML IJ SOLN
4.0000 mg | Freq: Once | INTRAMUSCULAR | Status: AC
  Administered 2017-07-29: 4 mg via INTRAVENOUS
  Filled 2017-07-29: qty 2

## 2017-07-29 MED ORDER — PRENATAL MULTIVITAMIN CH
1.0000 | ORAL_TABLET | Freq: Every day | ORAL | Status: DC
  Filled 2017-07-29 (×3): qty 1

## 2017-07-29 MED ORDER — OXYCODONE-ACETAMINOPHEN 5-325 MG PO TABS
1.0000 | ORAL_TABLET | ORAL | Status: DC | PRN
  Administered 2017-07-30: 1 via ORAL
  Administered 2017-07-30: 2 via ORAL
  Administered 2017-07-30: 1 via ORAL
  Filled 2017-07-29: qty 1
  Filled 2017-07-29: qty 2
  Filled 2017-07-29 (×2): qty 1

## 2017-07-29 MED ORDER — CALCIUM CARBONATE ANTACID 500 MG PO CHEW: 2.0000 | CHEWABLE_TABLET | ORAL | Status: DC | PRN

## 2017-07-29 MED ORDER — SODIUM CHLORIDE 0.9 % IV SOLN
INTRAVENOUS | Status: DC
  Administered 2017-07-29 – 2017-07-30 (×5): via INTRAVENOUS

## 2017-07-29 MED ORDER — LACTATED RINGERS IV SOLN
INTRAVENOUS | Status: DC
  Administered 2017-07-29: 13:00:00 via INTRAVENOUS

## 2017-07-29 NOTE — MAU Note (Signed)
Patient presents with left side kidney pain for a few days. Having nausea and vomiting for 3 days.

## 2017-07-29 NOTE — MAU Note (Signed)
Explained to patient need to obtain a fhr tracing and that she would have to lay back to obtain, patient continues to sit straight up

## 2017-07-29 NOTE — MAU Provider Note (Signed)
History     CSN: [id number]  Arrival date and time: 07/29/17 8413   First Provider Initiated Contact with Patient 07/29/17 [phone]     Chief Complaint  Patient presents with  . Back Pain   HPI Tiffany [name] is a 21 y.o. G1P0 at [redacted]w[redacted]d who presents with left flank pain for 3 days. She states it had a sudden onset and is constant. She rates the pain a 10/10 and tried percocet for it with no relief. She also reports constant nausea and vomiting since the pain started. She denies any dysuria or frequency. Denies any fever or chills at home. 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 She states she was seen at [facility] last night but states they "didn't do anything for me." She denies any leaking or bleeding. She reports good fetal movement. Denies any abdominal pain.   OB History    Gravida Para Term Preterm AB Living   1             SAB TAB Ectopic Multiple Live Births                  Past Medical History:  Diagnosis Date  . Anxiety   . Asthma   . Bronchitis in child   . Depression   . Eczema     Past Surgical History:  Procedure Laterality Date  . WISDOM TOOTH EXTRACTION      Family History  Problem Relation Age of Onset  . COPD Mother   . Diabetes Mother        pre-diabetes  . Other Mother        boils  . COPD Paternal Grandfather   . Diabetes Paternal Grandmother   . COPD Maternal Grandmother   . Thyroid disease Maternal Grandmother   . Aneurysm Maternal Grandfather   . Thyroid disease Maternal Aunt     Social History   Tobacco Use  . Smoking status: Former Smoker    Years: 1.00    Types: Cigarettes  . Smokeless tobacco: Never Used  Substance Use Topics  . Alcohol use: No  . Drug use: Yes    Types: Marijuana    Comment: daily;"helps with nausea"    Allergies: No Known Allergies  Medications Prior to Admission  Medication Sig Dispense Refill Last Dose  .  albuterol (PROVENTIL HFA;VENTOLIN HFA) 108 (90 Base) MCG/ACT inhaler Inhale into the lungs every 6 (six) hours as needed for wheezing or shortness of breath.   Taking  . Doxylamine-Pyridoxine 10-10 MG TBEC 2 PO qhs; may take 1po in am and 1po in afternoon prn nausea 120 tablet 3 Taking  . escitalopram (LEXAPRO) 10 MG tablet Take 1 tablet (10 mg total) by mouth daily. 30 tablet 6 Taking  . flintstones complete (FLINTSTONES) 60 MG chewable tablet Take 2 daily   Taking  . Ivermectin (SKLICE) 0.5 % LOTN Apply to head- leave on - lather rinse repeat 117 g 0   . metoCLOPramide (REGLAN) 10 MG tablet Take 1 tablet (10 mg total) by mouth every 6 (six) hours as needed for nausea (nausea/headache). (Patient not taking: Reported on 05/11/2017) 30 tablet 0 Not Taking  . oxyCODONE-acetaminophen (PERCOCET/ROXICET) 5-325 MG tablet Take 1-2 tablets every 4 (four) hours as needed by mouth for moderate pain. 15 tablet 0 Taking  . promethazine (PHENERGAN) 25 MG tablet Take 1 tablet (25 mg total) every 6 (six) hours as  needed by mouth for nausea or vomiting. (Patient not taking: Reported on 06/29/2017) 30 tablet 0 Not Taking  . scopolamine (TRANSDERM-SCOP, 1.5 MG,) 1 MG/3DAYS Place 1 patch (1.5 mg total) every 3 (three) days onto the skin. 10 patch 12 Taking  . triamcinolone cream (KENALOG) 0.1 % Apply 1 application topically 2 (two) times daily. 454 g 2 Taking    Review of Systems  Constitutional: Negative.  Negative for fatigue and fever.  HENT: Negative.   Respiratory: Negative.  Negative for shortness of breath.   Cardiovascular: Negative.  Negative for chest pain.  Gastrointestinal: Positive for nausea and vomiting. Negative for abdominal pain, constipation and diarrhea.  Genitourinary: Negative.  Negative for dysuria.  Musculoskeletal: Positive for back pain.  Neurological: Negative.  Negative for dizziness and headaches.   Physical Exam   Blood pressure 106/70, pulse (!) 127, temperature (!) 97 F  (36.1 C), temperature source Oral, resp. rate 16, last menstrual period 02/27/2017.  Physical Exam  Nursing note and vitals reviewed. Constitutional: She is oriented to person, place, and time. She appears well-developed and well-nourished. No distress.  HENT:  Head: Normocephalic.  Eyes: Pupils are equal, round, and reactive to light.  Cardiovascular: Normal rate, regular rhythm and normal heart sounds.  Respiratory: Effort normal and breath sounds normal. No respiratory distress.  GI: Soft. Bowel sounds are normal. She exhibits no distension. There is no tenderness. There is CVA tenderness.  Neurological: She is alert and oriented to person, place, and time.  Skin: Skin is warm and dry.  Psychiatric: She has a normal mood and affect. Her behavior is normal. Judgment and thought content normal.    MAU Course  Procedures Results for orders placed or performed during the hospital encounter of 07/29/17 (from the past 24 hour(s))  Urinalysis, Routine w reflex microscopic     Status: Abnormal   Collection Time: 07/29/17  9:22 AM  Result Value Ref Range   Color, Urine AMBER (A) YELLOW   APPearance CLOUDY (A) CLEAR   Specific Gravity, Urine 1.028 1.005 - 1.030   pH 5.0 5.0 - 8.0   Glucose, UA NEGATIVE NEGATIVE mg/dL   Hgb urine dipstick SMALL (A) NEGATIVE   Bilirubin Urine SMALL (A) NEGATIVE   Ketones, ur 80 (A) NEGATIVE mg/dL   Protein, ur 409100 (A) NEGATIVE mg/dL   Nitrite NEGATIVE NEGATIVE   Leukocytes, UA MODERATE (A) NEGATIVE   RBC / HPF 6-30 0 - 5 RBC/hpf   WBC, UA TOO NUMEROUS TO COUNT 0 - 5 WBC/hpf   Bacteria, UA RARE (A) NONE SEEN   Squamous Epithelial / LPF TOO NUMEROUS TO COUNT (A) NONE SEEN   Mucus PRESENT    Granular Casts, UA PRESENT   CBC with Differential/Platelet     Status: Abnormal (Preliminary result)   Collection Time: 07/29/17  9:57 AM  Result Value Ref Range   WBC 21.6 (H) 4.0 - 10.5 K/uL   RBC 4.61 3.87 - 5.11 MIL/uL   Hemoglobin 14.1 12.0 - 15.0 g/dL    HCT 81.140.9 91.436.0 - 78.246.0 %   MCV 88.7 78.0 - 100.0 fL   MCH 30.6 26.0 - 34.0 pg   MCHC 34.5 30.0 - 36.0 g/dL   RDW 95.614.0 21.311.5 - 08.615.5 %   Platelets 346 150 - 400 K/uL   Neutrophils Relative % 79 %   Neutro Abs 17.1 (H) 1.7 - 7.7 K/uL   Lymphocytes Relative 14 %   Lymphs Abs 2.9 0.7 - 4.0 K/uL   Monocytes Relative 5 %  Monocytes Absolute 1.2 (H) 0.1 - 1.0 K/uL   Eosinophils Relative 2 %   Eosinophils Absolute 0.4 0.0 - 0.7 K/uL   Basophils Relative 0 %   Basophils Absolute 0.0 0.0 - 0.1 K/uL   Other PENDING %   [place] Renal  Result Date: 07/29/2017 CLINICAL DATA:  Left abdominal and flank pain over the last few days. When [redacted] weeks pregnant. EXAM: RENAL / URINARY TRACT ULTRASOUND COMPLETE COMPARISON:  07/27/2017 FINDINGS: Right Kidney: Length: 10.8 cm. Echogenicity within normal limits. No mass or hydronephrosis visualized. Left Kidney: Length: 10.9 cm. Echogenicity within normal limits. No mass or hydronephrosis visualized. Bladder: Appears normal for degree of bladder distention. The technologist suggest the presence of a mass lesion in the left abdomen measuring approximately 5 x 2 x 2 cm. Based on the images, I do not think this as well characterized. Consider abdominal MRI for further evaluation. IMPRESSION: Normal appearance of the urinary tract. Technologist suggests the presence of a mass lesion in the left upper quadrant separate from the normal organs. I do not think the ultrasound study is conclusive. Consider abdominal MRI. Electronically Signed   By: Paulina FusiMark  Shogry M.D.   On: 07/29/2017 11:28    MDM UA CBC with Diff CMP IV LR Diluadid 1mg  Called Dr. Karin GoldenShogry to review u/s results. Unable to determine etiology of mass based on ultrasound. Reviewed results with Dr. Alysia PennaErvin. Will admit to [facility] for treatment of pyelonephritis and reassess mass in 2 days.  Assessment and Plan   1. Pyelonephritis affecting pregnancy in second trimester    -Admit to [facility] -Rocephin IV for  pyelonephritis -Dilaudid for severe pain and percocet PRN -NST q shift  Rolm BookbinderCaroline M Neill CNM 07/29/2017, 10:23 AM

## 2017-07-29 NOTE — H&P (Signed)
History     CSN: [id number]  Arrival date and time: 07/29/17 [id number]   First Provider Initiated Contact with Patient 07/29/17 [id number]     Chief Complaint  Patient presents with  . Back Pain   HPI Tiffany [name] is a 21 y.o. G1P0 at 6957w6d who presents with left flank pain for 3 days. She states it had a sudden onset and is constant. She rates the pain a 10/10 and tried percocet for it with no relief. She also reports constant nausea and vomiting since the pain started. She denies any dysuria or frequency. Denies any fever or chills at home. 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 She states [facility] but states they "didn't do anything for me." She denies any leaking or bleeding. She reports good fetal movement. Denies any abdominal pain.   OB History    Gravida Para Term Preterm AB Living   1             SAB TAB Ectopic Multiple Live Births                  Past Medical History:  Diagnosis Date  . Anxiety   . Asthma   . Bronchitis in child   . Depression   . Eczema     Past Surgical History:  Procedure Laterality Date  . WISDOM TOOTH EXTRACTION      Family History  Problem Relation Age of Onset  . COPD Mother   . Diabetes Mother        pre-diabetes  . Other Mother        boils  . COPD Paternal Grandfather   . Diabetes Paternal Grandmother   . COPD Maternal Grandmother   . Thyroid disease Maternal Grandmother   . Aneurysm Maternal Grandfather   . Thyroid disease Maternal Aunt     Social History   Tobacco Use  . Smoking status: Former Smoker    Years: 1.00    Types: Cigarettes  . Smokeless tobacco: Never Used  Substance Use Topics  . Alcohol use: No  . Drug use: Yes    Types: Marijuana    Comment: daily;"helps with nausea"    Allergies: No Known Allergies  Medications Prior to Admission  Medication Sig Dispense Refill Last Dose  .  albuterol (PROVENTIL HFA;VENTOLIN HFA) 108 (90 Base) MCG/ACT inhaler Inhale into the lungs every 6 (six) hours as needed for wheezing or shortness of breath.   Taking  . Doxylamine-Pyridoxine 10-10 MG TBEC 2 PO qhs; may take 1po in am and 1po in afternoon prn nausea 120 tablet 3 Taking  . escitalopram (LEXAPRO) 10 MG tablet Take 1 tablet (10 mg total) by mouth daily. 30 tablet 6 Taking  . flintstones complete (FLINTSTONES) 60 MG chewable tablet Take 2 daily   Taking  . Ivermectin (SKLICE) 0.5 % LOTN Apply to head- leave on 10min- lather rinse repeat 117 g 0   . metoCLOPramide (REGLAN) 10 MG tablet Take 1 tablet (10 mg total) by mouth every 6 (six) hours as needed for nausea (nausea/headache). (Patient not taking: Reported on 05/11/2017) 30 tablet 0 Not Taking  . oxyCODONE-acetaminophen (PERCOCET/ROXICET) 5-325 MG tablet Take 1-2 tablets every 4 (four) hours as needed by mouth for moderate pain. 15 tablet 0 Taking  . promethazine (PHENERGAN) 25 MG tablet Take 1 tablet (25 mg total) every 6 (six) hours  as needed by mouth for nausea or vomiting. (Patient not taking: Reported on 06/29/2017) 30 tablet 0 Not Taking  . scopolamine (TRANSDERM-SCOP, 1.5 MG,) 1 MG/3DAYS Place 1 patch (1.5 mg total) every 3 (three) days onto the skin. 10 patch 12 Taking  . triamcinolone cream (KENALOG) 0.1 % Apply 1 application topically 2 (two) times daily. 454 g 2 Taking    Review of Systems  Constitutional: Negative.  Negative for fatigue and fever.  HENT: Negative.   Respiratory: Negative.  Negative for shortness of breath.   Cardiovascular: Negative.  Negative for chest pain.  Gastrointestinal: Positive for nausea and vomiting. Negative for abdominal pain, constipation and diarrhea.  Genitourinary: Negative.  Negative for dysuria.  Musculoskeletal: Positive for back pain.  Neurological: Negative.  Negative for dizziness and headaches.   Physical Exam   Blood pressure 106/70, pulse (!) 127, temperature (!) 97 F  (36.1 C), temperature source Oral, resp. rate 16, last menstrual period 02/27/2017.  Physical Exam  Nursing note and vitals reviewed. Constitutional: She is oriented to person, place, and time. She appears well-developed and well-nourished. No distress.  HENT:  Head: Normocephalic.  Eyes: Pupils are equal, round, and reactive to light.  Cardiovascular: Normal rate, regular rhythm and normal heart sounds.  Respiratory: Effort normal and breath sounds normal. No respiratory distress.  GI: Soft. Bowel sounds are normal. She exhibits no distension. There is no tenderness. There is CVA tenderness.  Neurological: She is alert and oriented to person, place, and time.  Skin: Skin is warm and dry.  Psychiatric: She has a normal mood and affect. Her behavior is normal. Judgment and thought content normal.    MAU Course  Procedures Results for orders placed or performed during the hospital encounter of 07/29/17 (from the past 24 hour(s))  Urinalysis, Routine w reflex microscopic     Status: Abnormal   Collection Time: 07/29/17  9:22 AM  Result Value Ref Range   Color, Urine AMBER (A) YELLOW   APPearance CLOUDY (A) CLEAR   Specific Gravity, Urine 1.028 1.005 - 1.030   pH 5.0 5.0 - 8.0   Glucose, UA NEGATIVE NEGATIVE mg/dL   Hgb urine dipstick SMALL (A) NEGATIVE   Bilirubin Urine SMALL (A) NEGATIVE   Ketones, ur 80 (A) NEGATIVE mg/dL   Protein, ur 324100 (A) NEGATIVE mg/dL   Nitrite NEGATIVE NEGATIVE   Leukocytes, UA MODERATE (A) NEGATIVE   RBC / HPF 6-30 0 - 5 RBC/hpf   WBC, UA TOO NUMEROUS TO COUNT 0 - 5 WBC/hpf   Bacteria, UA RARE (A) NONE SEEN   Squamous Epithelial / LPF TOO NUMEROUS TO COUNT (A) NONE SEEN   Mucus PRESENT    Granular Casts, UA PRESENT   CBC with Differential/Platelet     Status: Abnormal (Preliminary result)   Collection Time: 07/29/17  9:57 AM  Result Value Ref Range   WBC 21.6 (H) 4.0 - 10.5 K/uL   RBC 4.61 3.87 - 5.11 MIL/uL   Hemoglobin 14.1 12.0 - 15.0 g/dL    HCT 40.140.9 02.736.0 - 25.346.0 %   MCV 88.7 78.0 - 100.0 fL   MCH 30.6 26.0 - 34.0 pg   MCHC 34.5 30.0 - 36.0 g/dL   RDW 66.414.0 40.311.5 - 47.415.5 %   Platelets 346 150 - 400 K/uL   Neutrophils Relative % 79 %   Neutro Abs 17.1 (H) 1.7 - 7.7 K/uL   Lymphocytes Relative 14 %   Lymphs Abs 2.9 0.7 - 4.0 K/uL   Monocytes Relative  5 %   Monocytes Absolute 1.2 (H) 0.1 - 1.0 K/uL   Eosinophils Relative 2 %   Eosinophils Absolute 0.4 0.0 - 0.7 K/uL   Basophils Relative 0 %   Basophils Absolute 0.0 0.0 - 0.1 K/uL   Other PENDING %   US Renal  Result Date: 07/29/2017 CLINICAL DATA:  Left abdominal and flank pain over the last few days. When [redacted] weeks pregnant. EXAM: RENAL / URINARY TRACT ULTRASOUND COMPLETE COMPARISON:  07/27/2017 FINDINGS: Right Kidney: Length: 10.8 cm. Echogenicity within normal limits. No mass or hydronephrosis visualized. Left Kidney: Length: 10.9 cm. Echogenicity within normal limits. No mass or hydronephrosis visualized. Bladder: Appears normal for degree of bladder distention. The technologist suggest the presence of a mass lesion in the left abdomen measuring approximately 5 x 2 x 2 cm. Based on the images, I do not think this as well characterized. Consider abdominal MRI for further evaluation. IMPRESSION: Normal appearance of the urinary tract. Technologist suggests the presence of a mass lesion in the left upper quadrant separate from the normal organs. I do not think the ultrasound study is conclusive. Consider abdominal MRI. Electronically Signed   By: Paulina Fusi M.D.   On: 07/29/2017 11:28    MDM UA CBC with Diff CMP IV LR Diluadid 1mg  Called Dr. Karin Golden to review u/s results. Unable to determine etiology of mass based on ultrasound. Reviewed results with Dr. Alysia Penna. Will admit to [facility] for treatment of pyelonephritis and reassess pain and mass in 48 hours.  Assessment and Plan   1. Pyelonephritis affecting pregnancy in second trimester    -Admit to [facility] -Rocephin  IV for pyelonephritis -Dilaudid for severe pain and percocet PRN -NST q shift  Rolm Bookbinder CNM 07/29/2017, 10:23 AM    OB Attending Pt seen and examined. Suspect pt has pyelonephritis and will treat according. Unsure as to what "mass/lesion" noted on U/S. Radiology was unable to give any additional insight due to remote read. Will treat with IV antibiotics and consider repeating U/S in 48 hrs. POC reviewed with pt.   Nettie Elm, MD

## 2017-07-30 MED ORDER — SCOPOLAMINE 1 MG/3DAYS TD PT72
1.0000 | MEDICATED_PATCH | TRANSDERMAL | Status: DC
  Administered 2017-07-30: 1.5 mg via TRANSDERMAL
  Filled 2017-07-30: qty 1

## 2017-07-30 NOTE — Progress Notes (Signed)
Difficult to trace FHR because patient was having argument with significant other on the phone and moving in bed.FHR tracing of 145 obtained continuously for 5 minutes with RN holding ultrasound transducer. Patient then stated that monitor belts were bothering her back and that she wanted EFM removed.

## 2017-07-30 NOTE — Progress Notes (Signed)
Patient ID: Phill MyronSavannah R Prew, female   DOB: 1996-05-21, 21 y.o.   MRN: [id number] FACULTY PRACTICE ANTEPARTUM(COMPREHENSIVE) NOTE  Phill MyronSavannah R Salemi is a 21 y.o. G1P0 with Estimated Date of Delivery: 11/19/17   By  early ultrasound 8689w0d  who is admitted for pyelonephritis.    Fetal presentation is unsure. Length of Stay:  1  Days  Date of admission:07/29/2017  Subjective: Left back is better Patient reports the fetal movement as active. Patient reports uterine contraction  activity as none. Patient reports  vaginal bleeding as none. Patient describes fluid per vagina as None.  Vitals:  Blood pressure 99/73, pulse (!) 110, temperature 98.4 F (36.9 C), temperature source Oral, resp. rate 18, height 5' 3.5" (1.613 m), weight 153 lb (69.4 kg), last menstrual period 02/27/2017, SpO2 100 %. Vitals:   07/29/17 1700 07/29/17 2005 07/29/17 2336 07/30/17 0455  BP: 131/77 (!) 117/98 111/68 99/73  Pulse: (!) 117 (!) 116 (!) 112 (!) 110  Resp: 18 18  18   Temp: 98.5 F (36.9 C) 98.8 F (37.1 C) 98 F (36.7 C) 98.4 F (36.9 C)  TempSrc: Oral Oral Axillary Oral  SpO2: 99% 99% 97% 100%  Weight:    153 lb (69.4 kg)  Height:       Physical Examination:  General appearance - alert, well appearing, and in no distress abd soft non tender Back exam - some left CVAT Fundal Height:  size equals dates Pelvic Exam:  examination not indicated Cervical Exam: Not evaluated. Extremities: extremities normal, atraumatic, no cyanosis or edema with DTRs 2+ bilaterally Membranes:FHR stable  Fetal Monitoring:       Labs:  Results for orders placed or performed during the hospital encounter of 07/29/17 (from the past 24 hour(s))  Urinalysis, Routine w reflex microscopic   Collection Time: 07/29/17  9:22 AM  Result Value Ref Range   Color, Urine AMBER (A) YELLOW   APPearance CLOUDY (A) CLEAR   Specific Gravity, Urine 1.028 1.005 - 1.030   pH 5.0 5.0 - 8.0   Glucose, UA NEGATIVE NEGATIVE mg/dL   Hgb urine  dipstick SMALL (A) NEGATIVE   Bilirubin Urine SMALL (A) NEGATIVE   Ketones, ur 80 (A) NEGATIVE mg/dL   Protein, ur 696100 (A) NEGATIVE mg/dL   Nitrite NEGATIVE NEGATIVE   Leukocytes, UA MODERATE (A) NEGATIVE   RBC / HPF 6-30 0 - 5 RBC/hpf   WBC, UA TOO NUMEROUS TO COUNT 0 - 5 WBC/hpf   Bacteria, UA RARE (A) NONE SEEN   Squamous Epithelial / LPF TOO NUMEROUS TO COUNT (A) NONE SEEN   Mucus PRESENT    Granular Casts, UA PRESENT   CBC with Differential/Platelet   Collection Time: 07/29/17  9:57 AM  Result Value Ref Range   WBC 21.6 (H) 4.0 - 10.5 K/uL   RBC 4.61 3.87 - 5.11 MIL/uL   Hemoglobin 14.1 12.0 - 15.0 g/dL   HCT 29.540.9 28.436.0 - 13.246.0 %   MCV 88.7 78.0 - 100.0 fL   MCH 30.6 26.0 - 34.0 pg   MCHC 34.5 30.0 - 36.0 g/dL   RDW 44.014.0 10.211.5 - 72.515.5 %   Platelets 346 150 - 400 K/uL   Neutrophils Relative % 79 %   Neutro Abs 17.1 (H) 1.7 - 7.7 K/uL   Lymphocytes Relative 14 %   Lymphs Abs 2.9 0.7 - 4.0 K/uL   Monocytes Relative 5 %   Monocytes Absolute 1.2 (H) 0.1 - 1.0 K/uL   Eosinophils Relative 2 %  Eosinophils Absolute 0.4 0.0 - 0.7 K/uL   Basophils Relative 0 %   Basophils Absolute 0.0 0.0 - 0.1 K/uL  Comprehensive metabolic panel   Collection Time: 07/29/17  9:57 AM  Result Value Ref Range   Sodium 129 (L) 135 - 145 mmol/L   Potassium 3.4 (L) 3.5 - 5.1 mmol/L   Chloride 89 (L) 101 - 111 mmol/L   CO2 23 22 - 32 mmol/L   Glucose, Bld 74 65 - 99 mg/dL   BUN 8 6 - 20 mg/dL   Creatinine, Ser 6.570.68 0.44 - 1.00 mg/dL   Calcium 9.7 8.9 - 84.610.3 mg/dL   Total Protein 7.8 6.5 - 8.1 g/dL   Albumin 3.5 3.5 - 5.0 g/dL   AST 16 15 - 41 U/L   ALT 12 (L) 14 - 54 U/L   Alkaline Phosphatase 118 38 - 126 U/L   Total Bilirubin 1.1 0.3 - 1.2 mg/dL   GFR calc non Af Amer >60 >60 mL/min   GFR calc Af Amer >60 >60 mL/min   Anion gap 17 (H) 5 - 15  [facility] and screen [facility]    Collection Time: 07/29/17 12:52 PM  Result Value Ref Range   ABO/RH(D) O POS    Antibody Screen NEG     Sample Expiration 08/01/2017     Imaging Studies:      Medications:  Scheduled . docusate sodium  100 mg Oral Daily  . prenatal multivitamin  1 tablet Oral Q1200   I have reviewed the patient's current medications.  ASSESSMENT: G1P0 1431w0d Estimated Date of Delivery: 11/19/17  Patient Active Problem List   Diagnosis Date Noted  . Pyelonephritis affecting pregnancy in second trimester 07/29/2017  . 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 Supervision of normal first pregnancy 05/11/2017  . Marijuana use 05/11/2017  . Gastroesophageal reflux disease without esophagitis 03/07/2017  . Depression 03/07/2017  . Intrinsic eczema 03/07/2017    PLAN: Continue IV rocephin Anticipate discharge in 24-48 hours Awaiting urine culture Repeat CBC in am  Amaryllis DykeLuther H Eure 07/30/2017,7:55 AM

## 2017-07-30 NOTE — Progress Notes (Signed)
Maternal HR graphing at intervals throughout this tracing. RN present during last 5 minutes of tracing and heard fetal tracing in the 150's while holding ultrasound transducer to abdomen.

## 2017-07-30 NOTE — Progress Notes (Signed)
Pt refused to be place on fetal monitoring after several attempts. Stated her back was hurting a could not tolerate the belts. Pt agreed to at least get a FHR with the monitor. FHR- 163 at 2337.

## 2017-07-30 NOTE — Progress Notes (Signed)
Pt sat up in bed to take pain medicine by mouth, picking up maternal heart rate.  Pt laid backed down fhr back up to baseline of 150.

## 2017-07-31 DIAGNOSIS — O2302 Infections of kidney in pregnancy, second trimester: Principal | ICD-10-CM

## 2017-07-31 LAB — CBC WITH DIFFERENTIAL/PLATELET
BASOS ABS: 0 10*3/uL (ref 0.0–0.1)
Basophils Relative: 0 %
EOS PCT: 2 %
Eosinophils Absolute: 0.3 10*3/uL (ref 0.0–0.7)
HCT: 33.8 % — ABNORMAL LOW (ref 36.0–46.0)
Hemoglobin: 11.5 g/dL — ABNORMAL LOW (ref 12.0–15.0)
LYMPHS PCT: 19 %
Lymphs Abs: 2 10*3/uL (ref 0.7–4.0)
MCH: 29.9 pg (ref 26.0–34.0)
MCHC: 34 g/dL (ref 30.0–36.0)
MCV: 88 fL (ref 78.0–100.0)
MONO ABS: 0.6 10*3/uL (ref 0.1–1.0)
MONOS PCT: 6 %
Neutro Abs: 7.8 10*3/uL — ABNORMAL HIGH (ref 1.7–7.7)
Neutrophils Relative %: 73 %
PLATELETS: 340 10*3/uL (ref 150–400)
RBC: 3.84 MIL/uL — ABNORMAL LOW (ref 3.87–5.11)
RDW: 13.7 % (ref 11.5–15.5)
WBC: 10.7 10*3/uL — ABNORMAL HIGH (ref 4.0–10.5)

## 2017-07-31 MED ORDER — OMEPRAZOLE 20 MG PO CPDR: 20.0000 mg | DELAYED_RELEASE_CAPSULE | Freq: Every day | ORAL | 6 refills | Status: DC

## 2017-07-31 MED ORDER — CEPHALEXIN 500 MG PO CAPS: 500.0000 mg | ORAL_CAPSULE | Freq: Four times a day (QID) | ORAL | 0 refills | Status: DC

## 2017-07-31 MED ORDER — PROMETHAZINE HCL 25 MG PO TABS: 25.0000 mg | ORAL_TABLET | Freq: Four times a day (QID) | ORAL | 1 refills | Status: DC | PRN

## 2017-07-31 MED ORDER — SCOPOLAMINE 1 MG/3DAYS TD PT72: 1.0000 | MEDICATED_PATCH | TRANSDERMAL | 12 refills | Status: DC

## 2017-07-31 NOTE — Progress Notes (Signed)
Discharged home ambulatory in stable condition.

## 2017-07-31 NOTE — Progress Notes (Signed)
Discharge instructions given to patient and she verbalized understanding of all instructions given. Written copy of AVS given to patient. 

## 2017-07-31 NOTE — Progress Notes (Signed)
Spoke with Dr. Despina HiddenEure to inform him that pts RN entered room to find her with IV mostly removed and stating that her MD told her everything was coming out today. Pts RN completed IV removal for pt safety. Pt has IV Rocephin due at 1330, but has already received 2 doses. Called to confirm if pt needed a new IV placement. MD states he plans to send patient home and will be by to discuss this with pt as soon as he is able. Will pass along to pts RN. Sheryn BisonGordon, Stanford Strauch Warner

## 2017-07-31 NOTE — Discharge Summary (Signed)
Physician Discharge Summary  Patient ID: Phill MyronSavannah R Innocent MRN: [id number] DOB/AGE: 1996-07-14 21 y.o.  Admit date: 07/29/2017 Discharge date: 07/31/2017  Admission Diagnoses: 6127w6d pyelonephritis Discharge Diagnoses:  Active Problems:   Pyelonephritis affecting pregnancy in second trimester   Discharged Condition: good  Hospital Course: WBC 21.7 with +CVAT and positive urine, admitted on rocephin 2 grams q 24 and received 72 hours of therapy, resolved pain  Consults: None  Significant Diagnostic Studies: labs:   Treatments: antibiotics: ceftriaxone  Discharge Exam: Blood pressure 110/67, pulse 75, temperature 98.8 F (37.1 C), temperature source Oral, resp. rate 18, height 5' 3.5" (1.613 m), weight 153 lb (69.4 kg), last menstrual period 02/27/2017, SpO2 99 %. General appearance: alert, cooperative and no distress abdomen is soft Back No CVAT Disposition: 01-Home or Self Care    Follow-up Information    [facility] Follow up on 08/03/2017.   Why:  ob visit: pt already has appointment scheduled Contact information: [address] [place] [phone]          Signed: Lazaro ArmsLuther H Eure 07/31/2017, 11:31 AM

## 2017-07-31 NOTE — Progress Notes (Signed)
When I arrived in patient's room, she had peeled off most of IV dressing and tape, and informed nurse that her physician had spoken with her and told her that she was going home today and she could have her IV taken out. When I told her that I did not have an order to discontinue her IV Rocephin, she insisted that MD had informed her that she was going home and her IV could be removed.

## 2017-07-31 NOTE — Progress Notes (Signed)
Patient ID: Tiffany [name], female   DOB: Mar 01, 1996, 21 y.o.   MRN: [id number] ACULTY PRACTICE ANTEPARTUM COMPREHENSIVE PROGRESS NOTE  Tiffany MyronSavannah R Thivierge is a 21 y.o. G1P0 at 4414w1d  who is admitted for pyelonephritis.   Fetal presentation is unsure. Length of Stay:  2  Days  Subjective: Pt reports feeling better. Less pain. Tolerating diet. + FM   Vitals:  Blood pressure 131/86, pulse (!) 101, temperature 98.6 F (37 C), temperature source Oral, resp. rate 20, height 5' 3.5" (1.613 m), weight 69.4 kg (153 lb), last menstrual period 02/27/2017, SpO2 100 %.   Physical Examination: Lungs clear Heart RRR Abd soft + BS gravid  Min CVA tenderness Ext non tender  Fetal Monitoring:  150's  Labs:  Results for orders placed or performed during the hospital encounter of 07/29/17 (from the past 24 hour(s))  CBC with Differential/Platelet   Collection Time: 07/31/17  5:23 AM  Result Value Ref Range   WBC 10.7 (H) 4.0 - 10.5 K/uL   RBC 3.84 (L) 3.87 - 5.11 MIL/uL   Hemoglobin 11.5 (L) 12.0 - 15.0 g/dL   HCT 11.933.8 (L) 14.736.0 - 82.946.0 %   MCV 88.0 78.0 - 100.0 fL   MCH 29.9 26.0 - 34.0 pg   MCHC 34.0 30.0 - 36.0 g/dL   RDW 56.213.7 13.011.5 - 86.515.5 %   Platelets 340 150 - 400 K/uL   Neutrophils Relative % 73 %   Neutro Abs 7.8 (H) 1.7 - 7.7 K/uL   Lymphocytes Relative 19 %   Lymphs Abs 2.0 0.7 - 4.0 K/uL   Monocytes Relative 6 %   Monocytes Absolute 0.6 0.1 - 1.0 K/uL   Eosinophils Relative 2 %   Eosinophils Absolute 0.3 0.0 - 0.7 K/uL   Basophils Relative 0 %   Basophils Absolute 0.0 0.0 - 0.1 K/uL    Imaging Studies:    none   Medications:  Scheduled . docusate sodium  100 mg Oral Daily  . prenatal multivitamin  1 tablet Oral Q1200  . scopolamine  1 patch Transdermal Q72H   I have reviewed the patient's current medications.  ASSESSMENT: IUP 24 1/7 weeks Left Pyelonephritis  PLAN: WBC down, afebrile, pain resolving. UC pending. Continue with Rocphin til UC returns. Possible discharge  later today or tomorrow Continue routine antenatal care.   Hermina StaggersMichael L Ervin 07/31/2017,6:50 AM

## 2017-08-01 LAB — CULTURE, OB URINE: Culture: NO GROWTH

## 2017-08-03 ENCOUNTER — Encounter: Payer: Medicaid Other | Admitting: Obstetrics & Gynecology

## 2017-08-09 NOTE — L&D Delivery Note (Signed)
Patient is a 22 y.o. now G2P1 s/p NSVD at 5339w5d, who was admitted for IOL for FGR (7.3%).  She progressed with augmentation (Cytotec, FB, AROM, Pitocin) to complete and pushed 20 minutes to deliver.  Cord clamping delayed by one minutes then clamped by CNM and cut by [name]. Code APGAR called, baby handed off to NICU. Placenta intact and spontaneous, bleeding minimal.  Bilateral labial laceration identified- hemostatic, not repaired.  She plans on breastfeeding. She is unsure about birth control.   Delivery Note At 7:20 PM a viable female was delivered via Vaginal, Spontaneous (Presentation: ROA ).  APGAR: 2, 4, 7 ; weight pending .   Placenta intact and spontaneous, bleeding minimal. 3V Cord. Cord pH: pending  Anesthesia: Nitrous   Episiotomy: None Lacerations: Bilateral Labial- hemostatic, not repaired  Suture Repair: none Est. Blood Loss (mL): 150  Mom to postpartum.  Baby to NICU.  Sharyon CableVeronica C Aileena [name] CNM 11/24/2017, 7:47 PM

## 2017-08-10 ENCOUNTER — Ambulatory Visit (INDEPENDENT_AMBULATORY_CARE_PROVIDER_SITE_OTHER): Payer: Medicaid Other | Admitting: Obstetrics & Gynecology

## 2017-08-10 ENCOUNTER — Encounter: Payer: Self-pay | Admitting: Obstetrics & Gynecology

## 2017-08-10 VITALS — BP 90/60 | HR 92 | Wt 156.0 lb

## 2017-08-10 DIAGNOSIS — Z3402 Encounter for supervision of normal first pregnancy, second trimester: Secondary | ICD-10-CM

## 2017-08-10 DIAGNOSIS — Z331 Pregnant state, incidental: Secondary | ICD-10-CM

## 2017-08-10 DIAGNOSIS — Z3A25 25 weeks gestation of pregnancy: Secondary | ICD-10-CM

## 2017-08-10 DIAGNOSIS — Z1389 Encounter for screening for other disorder: Secondary | ICD-10-CM

## 2017-08-10 LAB — POCT URINALYSIS DIPSTICK
Blood, UA: NEGATIVE
Glucose, UA: NEGATIVE
Ketones, UA: NEGATIVE
LEUKOCYTES UA: NEGATIVE
NITRITE UA: NEGATIVE

## 2017-08-10 MED ORDER — NITROFURANTOIN MONOHYD MACRO 100 MG PO CAPS: 100.0000 mg | ORAL_CAPSULE | Freq: Every day | ORAL | 4 refills | Status: DC

## 2017-08-10 NOTE — Progress Notes (Signed)
G1P0 5466w4d Estimated Date of Delivery: 11/19/17  Blood pressure 90/60, pulse 92, weight 156 lb (70.8 kg), last menstrual period 02/27/2017.   BP weight and urine results all reviewed and noted.  Please refer to the obstetrical flow sheet for the fundal height and fetal heart rate documentation:  Patient reports good fetal movement, denies any bleeding and no rupture of membranes symptoms or regular contractions. Patient is without complaints. All questions were answered.  Orders Placed This Encounter  Procedures  . POCT urinalysis dipstick    Plan:  Continued routine obstetrical care, PN2 next visit Start suppression s/p pyelonephritis  Return in about 3 weeks (around 08/31/2017) for PN2, LROB, with Dr Despina HiddenEure.

## 2017-09-02 ENCOUNTER — Encounter: Payer: Medicaid Other | Admitting: Obstetrics & Gynecology

## 2017-09-02 ENCOUNTER — Other Ambulatory Visit: Payer: Medicaid Other

## 2017-09-08 ENCOUNTER — Other Ambulatory Visit: Payer: Medicaid Other

## 2017-09-08 ENCOUNTER — Ambulatory Visit (INDEPENDENT_AMBULATORY_CARE_PROVIDER_SITE_OTHER): Payer: Medicaid Other | Admitting: Obstetrics and Gynecology

## 2017-09-08 ENCOUNTER — Encounter: Payer: Self-pay | Admitting: Obstetrics and Gynecology

## 2017-09-08 VITALS — BP 100/62 | HR 109 | Wt 160.2 lb

## 2017-09-08 DIAGNOSIS — Z131 Encounter for screening for diabetes mellitus: Secondary | ICD-10-CM

## 2017-09-08 DIAGNOSIS — Z331 Pregnant state, incidental: Secondary | ICD-10-CM

## 2017-09-08 DIAGNOSIS — Z1389 Encounter for screening for other disorder: Secondary | ICD-10-CM

## 2017-09-08 DIAGNOSIS — Z3403 Encounter for supervision of normal first pregnancy, third trimester: Secondary | ICD-10-CM

## 2017-09-08 DIAGNOSIS — Z3A29 29 weeks gestation of pregnancy: Secondary | ICD-10-CM

## 2017-09-08 DIAGNOSIS — Z3402 Encounter for supervision of normal first pregnancy, second trimester: Secondary | ICD-10-CM

## 2017-09-08 LAB — POCT URINALYSIS DIPSTICK
GLUCOSE UA: NEGATIVE
Ketones, UA: NEGATIVE
LEUKOCYTES UA: NEGATIVE
NITRITE UA: NEGATIVE
PROTEIN UA: NEGATIVE
RBC UA: NEGATIVE

## 2017-09-08 NOTE — Patient Instructions (Signed)
(  [phone] is the phone number for Pregnancy Classes or hospital tours at [facility].  ° °You will be referred to  [email or web address]   for more information on childbirth classes   °At this site you may register for classes. You may sign up for a waiting list if classes are full. Please SIGN UP FOR THIS!.   When the waiting list becomes long, sometimes new classes can be added. ° ° ° °

## 2017-09-08 NOTE — Progress Notes (Signed)
   LOW-RISK PREGNANCY VISIT Patient name: Tiffany [name] MRN [id number]  Date of birth: [birth year] Chief Complaint:   Routine Prenatal Visit (PN2; tightness in upper stomach)  History of Present Illness:   Tiffany [name] is a 22 y.o. G1P0 female at 4675w5d with an Estimated Date of Delivery: 11/19/17 being seen today for ongoing management of a low-risk pregnancy.  Today she reports some tightness in upper stomach.    Contractions: Irregular. Vag. Bleeding: None.  Movement: Present. denies leaking of fluid. Review of Systems:   Pertinent items are noted in HPI Denies abnormal vaginal discharge w/ itching/odor/irritation, headaches, visual changes, shortness of breath, chest pain, abdominal pain, severe nausea/vomiting, or problems with urination or bowel movements unless otherwise stated above. Pertinent History Reviewed:  Reviewed past medical,surgical, social, obstetrical and family history.  Reviewed problem list, medications and allergies. Physical Assessment:   Vitals:   09/08/17 0947  BP: 100/62  Pulse: (!) 109  Weight: 160 lb 3.2 oz (72.7 kg)  Body mass index is 27.93 kg/m.        Physical Examination:   General appearance: Well appearing, and in no distress  Mental status: Alert, oriented to person, place, and time  Skin: Warm & dry  Cardiovascular: Normal heart rate noted  Respiratory: Normal respiratory effort, no distress  Abdomen: Soft, gravid, nontender  Pelvic: Cervical exam deferred         Extremities: Edema: None  Fetal Status: Fetal Heart Rate (bpm): 155 Fundal Height: 30 cm Movement: Present    Results for orders placed or performed in visit on 09/08/17 (from the past 24 hour(s))  POCT urinalysis dipstick   Collection Time: 09/08/17  9:38 AM  Result Value Ref Range   Color, UA     Clarity, UA     Glucose, UA neg    Bilirubin, UA     Ketones, UA neg    Spec Grav, UA  1.010 - 1.025   Blood, UA neg    pH, UA  5.0 - 8.0   Protein, UA neg    Urobilinogen, UA  0.2 or 1.0 E.U./dL   Nitrite, UA neg    Leukocytes, UA Negative Negative   Appearance     Odor      Assessment & Plan:  1) Low-risk pregnancy G1P0 at 7975w5d with an Estimated Date of Delivery: 11/19/17   Meds: No orders of the defined types were placed in this encounter.  Labs/procedures today: Doppler  Plan:  Continue routine obstetrical care   Reviewed: Preterm labor symptoms and general obstetric precautions including but not limited to vaginal bleeding, contractions, leaking of fluid and fetal movement were reviewed in detail with the patient.  All questions were answered  Follow-up: Return in about 3 weeks (around 09/29/2017), or if symptoms worsen or fail to improve, for LROB.  Orders Placed This Encounter  Procedures  . POCT urinalysis dipstick     By signing my name below, I, Izna Ahmed, attest that this documentation has been prepared under the direction and in the presence of Tilda BurrowFerguson, Lilliauna Van V., MD. Electronically Signed: Redge GainerIzna Ahmed, Medical Scribe. 09/08/17. 10:17 AM.  I personally performed the services described in this documentation, which was SCRIBED in my presence. The recorded information has been reviewed and considered accurate. It has been edited as necessary during review. Tilda BurrowJohn V Vayden Weinand, MD

## 2017-09-09 LAB — RPR: RPR Ser Ql: NONREACTIVE

## 2017-09-09 LAB — CBC
HEMATOCRIT: 36.4 % (ref 34.0–46.6)
Hemoglobin: 12 g/dL (ref 11.1–15.9)
MCH: 29.1 pg (ref 26.6–33.0)
MCHC: 33 g/dL (ref 31.5–35.7)
MCV: 88 fL (ref 79–97)
Platelets: 379 10*3/uL (ref 150–379)
RBC: 4.12 x10E6/uL (ref 3.77–5.28)
RDW: 13.7 % (ref 12.3–15.4)
WBC: 11.1 10*3/uL — AB (ref 3.4–10.8)

## 2017-09-09 LAB — HIV ANTIBODY (ROUTINE TESTING W REFLEX): HIV SCREEN 4TH GENERATION: NONREACTIVE

## 2017-09-09 LAB — GLUCOSE TOLERANCE, 2 HOURS W/ 1HR
GLUCOSE, 2 HOUR: 101 mg/dL (ref 65–152)
Glucose, 1 hour: 95 mg/dL (ref 65–179)
Glucose, Fasting: 76 mg/dL (ref 65–91)

## 2017-09-09 LAB — ANTIBODY SCREEN: Antibody Screen: NEGATIVE

## 2017-09-12 ENCOUNTER — Telehealth: Payer: Self-pay | Admitting: *Deleted

## 2017-09-12 ENCOUNTER — Other Ambulatory Visit: Payer: Self-pay | Admitting: Obstetrics & Gynecology

## 2017-09-12 ENCOUNTER — Telehealth: Payer: Self-pay | Admitting: Obstetrics & Gynecology

## 2017-09-12 NOTE — Telephone Encounter (Signed)
Pt called requesting refill on phenergan. Informed the pt that she had one refill sent when the medication was ordered in December. She states that she had not called the pharmacy. Advised pt to call the pharmacy to request the refill and to call back if she has any issues. Pt verbalized understanding.

## 2017-09-12 NOTE — Telephone Encounter (Signed)
done

## 2017-09-12 NOTE — Telephone Encounter (Signed)
Pt states that her pharmacy told her she did not have any refills left on her phenergan. Informed pt that I would send her request for a refill to a provider and she should check with her pharmacy in the next 24 hours. Pt verbalized understanding.

## 2017-09-29 ENCOUNTER — Ambulatory Visit (INDEPENDENT_AMBULATORY_CARE_PROVIDER_SITE_OTHER): Payer: Medicaid Other | Admitting: Advanced Practice Midwife

## 2017-09-29 VITALS — BP 96/60 | HR 100 | Wt 165.0 lb

## 2017-09-29 DIAGNOSIS — Z1389 Encounter for screening for other disorder: Secondary | ICD-10-CM

## 2017-09-29 DIAGNOSIS — R05 Cough: Secondary | ICD-10-CM

## 2017-09-29 DIAGNOSIS — Z3A32 32 weeks gestation of pregnancy: Secondary | ICD-10-CM

## 2017-09-29 DIAGNOSIS — O9989 Other specified diseases and conditions complicating pregnancy, childbirth and the puerperium: Secondary | ICD-10-CM

## 2017-09-29 DIAGNOSIS — Z331 Pregnant state, incidental: Secondary | ICD-10-CM

## 2017-09-29 DIAGNOSIS — Z3403 Encounter for supervision of normal first pregnancy, third trimester: Secondary | ICD-10-CM

## 2017-09-29 MED ORDER — CEPHALEXIN 500 MG PO CAPS: 500.0000 mg | ORAL_CAPSULE | Freq: Three times a day (TID) | ORAL | 4 refills | Status: DC

## 2017-09-29 MED ORDER — AZITHROMYCIN 250 MG PO TABS: 250.0000 mg | ORAL_TABLET | Freq: Every day | ORAL | 0 refills | Status: DC

## 2017-09-29 NOTE — Patient Instructions (Signed)
[facility] Drug StoresSavannah R [name], I greatly value your feedback.  If you receive a survey following your visit with us today, we appreciate you taking the time to fill it out.  Thanks, Tiffany BeamsFran [name], CNM   Call the office [phone]) or go to [facility] if:  You begin to have strong, frequent contractions  Your water breaks.  Sometimes it is a big gush of fluid, sometimes it is just a trickle that keeps getting your panties wet or running down your legs  You have vaginal bleeding.  It is normal to have a small amount of spotting if your cervix was checked.   You don't feel your baby moving like normal.  If you don't, get you something to eat and drink and lay down and focus on feeling your baby move.  You should feel at least 10 movements in 2 hours.  If you don't, you should call the office or go to [facility].    Tdap Vaccine  It is recommended that you get the Tdap vaccine during the third trimester of EACH pregnancy to help protect your baby from getting pertussis (whooping cough)  27-36 weeks is the BEST time to do this so that you can pass the protection on to your baby. During pregnancy is better than after pregnancy, but if you are unable to get it during pregnancy it will be offered at the hospital.   You can get this vaccine at the health department or your family doctor  Everyone who will be around your baby should also be up-to-date on their vaccines. Adults (who are not pregnant) only need 1 dose of Tdap during adulthood.   Third Trimester of Pregnancy The third trimester is from week 29 through week 42, months 7 through 9. The third trimester is a time when the fetus is growing rapidly. At the end of the ninth month, the fetus is about 20 inches in length and weighs 6-10 pounds.  BODY CHANGES Your body goes through many changes during pregnancy. The changes vary from woman to woman.   Your weight will continue to increase. You can expect to gain 25-35 pounds (11-16 kg) by  the end of the pregnancy.  You may begin to get stretch marks on your hips, abdomen, and breasts.  You may urinate more often because the fetus is moving lower into your pelvis and pressing on your bladder.  You may develop or continue to have heartburn as a result of your pregnancy.  You may develop constipation because certain hormones are causing the muscles that push waste through your intestines to slow down.  You may develop hemorrhoids or swollen, bulging veins (varicose veins).  You may have pelvic pain because of the weight gain and pregnancy hormones relaxing your joints between the bones in your pelvis. Backaches may result from overexertion of the muscles supporting your posture.  You may have changes in your hair. These can include thickening of your hair, rapid growth, and changes in texture. Some women also have hair loss during or after pregnancy, or hair that feels dry or thin. Your hair will most likely return to normal after your baby is born.  Your breasts will continue to grow and be tender. A yellow discharge may leak from your breasts called colostrum.  Your belly button may stick out.  You may feel short of breath because of your expanding uterus.  You may notice the fetus "dropping," or moving lower in your abdomen.  You may have a bloody mucus  discharge. This usually occurs a few days to a week before labor begins.  Your cervix becomes thin and soft (effaced) near your due date. WHAT TO EXPECT AT YOUR PRENATAL EXAMS  You will have prenatal exams every 2 weeks until week 36. Then, you will have weekly prenatal exams. During a routine prenatal visit:  You will be weighed to make sure you and the fetus are growing normally.  Your blood pressure is taken.  Your abdomen will be measured to track your baby's growth.  The fetal heartbeat will be listened to.  Any test results from the previous visit will be discussed.  You may have a cervical check near your  due date to see if you have effaced. At around 36 weeks, your caregiver will check your cervix. At the same time, your caregiver will also perform a test on the secretions of the vaginal tissue. This test is to determine if a type of bacteria, Group B streptococcus, is present. Your caregiver will explain this further. Your caregiver may ask you:  What your birth plan is.  How you are feeling.  If you are feeling the baby move.  If you have had any abnormal symptoms, such as leaking fluid, bleeding, severe headaches, or abdominal cramping.  If you have any questions. Other tests or screenings that may be performed during your third trimester include:  Blood tests that check for low iron levels (anemia).  Fetal testing to check the health, activity level, and growth of the fetus. Testing is done if you have certain medical conditions or if there are problems during the pregnancy. FALSE LABOR You may feel small, irregular contractions that eventually go away. These are called Braxton Hicks contractions, or false labor. Contractions may last for hours, days, or even weeks before true labor sets in. If contractions come at regular intervals, intensify, or become painful, it is best to be seen by your caregiver.  SIGNS OF LABOR   Menstrual-like cramps.  Contractions that are 5 minutes apart or less.  Contractions that start on the top of the uterus and spread down to the lower abdomen and back.  A sense of increased pelvic pressure or back pain.  A watery or bloody mucus discharge that comes from the vagina. If you have any of these signs before the 37th week of pregnancy, call your caregiver right away. You need to go to the hospital to get checked immediately. HOME CARE INSTRUCTIONS   Avoid all smoking, herbs, alcohol, and unprescribed drugs. These chemicals affect the formation and growth of the baby.  Follow your caregiver's instructions regarding medicine use. There are medicines  that are either safe or unsafe to take during pregnancy.  Exercise only as directed by your caregiver. Experiencing uterine cramps is a good sign to stop exercising.  Continue to eat regular, healthy meals.  Wear a good support bra for breast tenderness.  Do not use hot tubs, steam rooms, or saunas.  Wear your seat belt at all times when driving.  Avoid raw meat, uncooked cheese, cat litter boxes, and soil used by cats. These carry germs that can cause birth defects in the baby.  Take your prenatal vitamins.  Try taking a stool softener (if your caregiver approves) if you develop constipation. Eat more high-fiber foods, such as fresh vegetables or fruit and whole grains. Drink plenty of fluids to keep your urine clear or pale yellow.  Take warm sitz baths to soothe any pain or discomfort caused by hemorrhoids. Use  hemorrhoid cream if your caregiver approves.  If you develop varicose veins, wear support hose. Elevate your feet for 15 minutes, 3-4 times a day. Limit salt in your diet.  Avoid heavy lifting, wear low heal shoes, and practice good posture.  Rest a lot with your legs elevated if you have leg cramps or low back pain.  Visit your dentist if you have not gone during your pregnancy. Use a soft toothbrush to brush your teeth and be gentle when you floss.  A sexual relationship may be continued unless your caregiver directs you otherwise.  Do not travel far distances unless it is absolutely necessary and only with the approval of your caregiver.  Take prenatal classes to understand, practice, and ask questions about the labor and delivery.  Make a trial run to the hospital.  Pack your hospital bag.  Prepare the baby's nursery.  Continue to go to all your prenatal visits as directed by your caregiver. SEEK MEDICAL CARE IF:  You are unsure if you are in labor or if your water has broken.  You have dizziness.  You have mild pelvic cramps, pelvic pressure, or nagging  pain in your abdominal area.  You have persistent nausea, vomiting, or diarrhea.  You have a bad smelling vaginal discharge.  You have pain with urination. SEEK IMMEDIATE MEDICAL CARE IF:   You have a fever.  You are leaking fluid from your vagina.  You have spotting or bleeding from your vagina.  You have severe abdominal cramping or pain.  You have rapid weight loss or gain.  You have shortness of breath with chest pain.  You notice sudden or extreme swelling of your face, hands, ankles, feet, or legs.  You have not felt your baby move in over an hour.  You have severe headaches that do not go away with medicine.  You have vision changes. Document Released: 07/20/2001 Document Revised: 07/31/2013 Document Reviewed: 09/26/2012 [facility] Patient Information 2015 [place], [place]. This information is not intended to replace advice given to you by your health care provider. Make sure you discuss any questions you have with your health care provider.

## 2017-09-29 NOTE — Progress Notes (Signed)
  G1P0 7166w5d Estimated Date of Delivery: 11/19/17  Blood pressure 96/60, pulse 100, weight 74.8 kg (165 lb), last menstrual period 02/27/2017.   BP weight and urine results all reviewed and noted.  Please refer to the obstetrical flow sheet for the fundal height and fetal heart rate documentation:  Patient reports good fetal movement, denies any bleeding and no rupture of membranes symptoms or regular contractions. C/O productive cough for 10 + days.  Some stuffiness. No SOB, wheezing All questions were answered.   Physical Assessment:   Vitals:   09/29/17 1329  BP: 96/60  Pulse: 100  Weight: 74.8 kg (165 lb)  Body mass index is 28.77 kg/m.        Physical Examination:   General appearance: Well appearing, and in no distress  Mental status: Alert, oriented to person, place, and time  Skin: Warm & dry  Cardiovascular: Normal heart rate noted  Respiratory: Normal respiratory effort, LCTAB, no distress  Abdomen: Soft, gravid, nontender  Pelvic: Cervical exam deferred         Extremities: Edema: None  Fetal Status: Fetal Heart Rate (bpm): 142 Fundal Height: 32 cm Movement: Present    No results found for this or any previous visit (from the past 24 hour(s)).   Orders Placed This Encounter  Procedures  . POCT Urinalysis Dipstick   32 Plan:  Continued routine obstetrical care, restart pyelo suppression zpack for URI sx X 10 days  Return in about 2 weeks (around 10/13/2017) for LROB.

## 2017-10-05 NOTE — Addendum Note (Signed)
Addended by: Moss McRESENZO, LATISHA M on: 10/05/2017 03:36 PM   Modules accepted: Orders

## 2017-10-10 ENCOUNTER — Other Ambulatory Visit: Payer: Self-pay | Admitting: Obstetrics & Gynecology

## 2017-10-10 ENCOUNTER — Telehealth: Payer: Self-pay | Admitting: *Deleted

## 2017-10-10 ENCOUNTER — Inpatient Hospital Stay (HOSPITAL_COMMUNITY)
Admission: AD | Admit: 2017-10-10 | Discharge: 2017-10-11 | Disposition: A | Discharge status: Home / Self Care | Payer: Medicaid Other | Source: Ambulatory Visit | Attending: Obstetrics & Gynecology | Admitting: Obstetrics & Gynecology

## 2017-10-10 ENCOUNTER — Encounter (HOSPITAL_COMMUNITY): Payer: Self-pay

## 2017-10-10 DIAGNOSIS — R509 Fever, unspecified: Secondary | ICD-10-CM | POA: Diagnosis present

## 2017-10-10 DIAGNOSIS — O99343 Other mental disorders complicating pregnancy, third trimester: Secondary | ICD-10-CM | POA: Insufficient documentation

## 2017-10-10 DIAGNOSIS — O99513 Diseases of the respiratory system complicating pregnancy, third trimester: Secondary | ICD-10-CM | POA: Insufficient documentation

## 2017-10-10 DIAGNOSIS — F419 Anxiety disorder, unspecified: Secondary | ICD-10-CM | POA: Diagnosis not present

## 2017-10-10 DIAGNOSIS — F329 Major depressive disorder, single episode, unspecified: Secondary | ICD-10-CM | POA: Insufficient documentation

## 2017-10-10 DIAGNOSIS — Z3A34 34 weeks gestation of pregnancy: Secondary | ICD-10-CM | POA: Diagnosis not present

## 2017-10-10 DIAGNOSIS — J45909 Unspecified asthma, uncomplicated: Secondary | ICD-10-CM | POA: Insufficient documentation

## 2017-10-10 DIAGNOSIS — J101 Influenza due to other identified influenza virus with other respiratory manifestations: Secondary | ICD-10-CM

## 2017-10-10 DIAGNOSIS — O9989 Other specified diseases and conditions complicating pregnancy, childbirth and the puerperium: Secondary | ICD-10-CM | POA: Diagnosis not present

## 2017-10-10 DIAGNOSIS — Z79899 Other long term (current) drug therapy: Secondary | ICD-10-CM | POA: Insufficient documentation

## 2017-10-10 DIAGNOSIS — R05 Cough: Secondary | ICD-10-CM | POA: Diagnosis present

## 2017-10-10 DIAGNOSIS — Z87891 Personal history of nicotine dependence: Secondary | ICD-10-CM | POA: Insufficient documentation

## 2017-10-10 LAB — URINALYSIS, ROUTINE W REFLEX MICROSCOPIC
Bacteria, UA: NONE SEEN
GLUCOSE, UA: NEGATIVE mg/dL
Hgb urine dipstick: NEGATIVE
KETONES UR: 80 mg/dL — AB
Leukocytes, UA: NEGATIVE
NITRITE: NEGATIVE
PH: 5 (ref 5.0–8.0)
Protein, ur: 100 mg/dL — AB
Specific Gravity, Urine: 1.029 (ref 1.005–1.030)

## 2017-10-10 MED ORDER — LACTATED RINGERS IV BOLUS (SEPSIS)
1000.0000 mL | Freq: Once | INTRAVENOUS | Status: AC
  Administered 2017-10-10: 1000 mL via INTRAVENOUS

## 2017-10-10 NOTE — Telephone Encounter (Signed)
Pt called requesting refill on Phenergan. She states that the last time she had it filled was at the beginning of February. Advised pt to call pharmacy for refill and let us know if they dont have it. Pt verbalized understanding.

## 2017-10-10 NOTE — MAU Note (Signed)
Pt here with c/o fever, cough, body aches, sore throat, chills. Denies any bleeding or leaking of fluid. Reports good fetal movement.

## 2017-10-10 NOTE — MAU Provider Note (Signed)
History     CSN: [id number]  Arrival date and time: 10/10/17 2237   First Provider Initiated Contact with Patient 10/10/17 2318       Chief Complaint  Patient presents with  . Cough  . Fever   HPI Tiffany [name] is a 22 y.o. G2P0 at 2386w2d who presents with a cough, sore throat, fever, chills and body aches. She states the symptoms started today. She denies any known exposure to the flu and declined her flu shot during prenatal care. She reports a fever of 100.3 and took tylenol for it. She denies any vaginal bleeding, leaking or discharge. Reports good fetal movement.   OB History    Gravida Para Term Preterm AB Living   2       1     SAB TAB Ectopic Multiple Live Births   1              Past Medical History:  Diagnosis Date  . Anxiety   . Asthma   . Bronchitis in child   . Depression   . Eczema     Past Surgical History:  Procedure Laterality Date  . WISDOM TOOTH EXTRACTION      Family History  Problem Relation Age of Onset  . COPD Mother   . Diabetes Mother        pre-diabetes  . Other Mother        boils  . COPD Paternal Grandfather   . Diabetes Paternal Grandmother   . COPD Maternal Grandmother   . Thyroid disease Maternal Grandmother   . Aneurysm Maternal Grandfather   . Thyroid disease Maternal Aunt     Social History   Tobacco Use  . Smoking status: Former Smoker    Years: 1.00    Types: Cigarettes  . Smokeless tobacco: Never Used  Substance Use Topics  . Alcohol use: No  . Drug use: Yes    Types: Marijuana    Comment: daily;"helps with nausea"    Allergies: No Known Allergies  Medications Prior to Admission  Medication Sig Dispense Refill Last Dose  . acetaminophen (TYLENOL) 160 MG/5ML liquid Take by mouth every 4 (four) hours as needed for fever.   10/10/2017 at 1900  . azithromycin (ZITHROMAX) 250 MG tablet Take 1 tablet (250 mg total) by mouth daily. Take 2 today, the 1 a day for 4 days 6 tablet 0 10/09/2017 at Unknown time  .  cephALEXin (KEFLEX) 500 MG capsule Take 1 capsule (500 mg total) by mouth 3 (three) times daily. 30 capsule 4 Past Week at Unknown time  . flintstones complete (FLINTSTONES) 60 MG chewable tablet Take 2 daily   10/09/2017 at Unknown time  . promethazine (PHENERGAN) 25 MG tablet TAKE 1 TABLET(25 MG) BY MOUTH EVERY 6 HOURS AS NEEDED FOR NAUSEA 30 tablet 0 10/10/2017 at Unknown time  . albuterol (PROVENTIL HFA;VENTOLIN HFA) 108 (90 Base) MCG/ACT inhaler Inhale into the lungs every 6 (six) hours as needed for wheezing or shortness of breath.   More than a month at Unknown time  . metoCLOPramide (REGLAN) 10 MG tablet Take 1 tablet (10 mg total) by mouth every 6 (six) hours as needed for nausea (nausea/headache). (Patient not taking: Reported on 09/29/2017) 30 tablet 0 Not Taking  . triamcinolone cream (KENALOG) 0.1 % Apply 1 application topically 2 (two) times daily. (Patient not taking: Reported on 09/29/2017) 454 g 2 Not Taking    Review of Systems  Constitutional: Positive for chills and fever. Negative  for fatigue.  HENT: Positive for congestion and sore throat.   Respiratory: Positive for cough. Negative for shortness of breath.   Cardiovascular: Negative.  Negative for chest pain.  Gastrointestinal: Negative.  Negative for abdominal pain, constipation, diarrhea, nausea and vomiting.  Genitourinary: Negative.  Negative for dysuria.  Neurological: Negative.  Negative for dizziness and headaches.   Physical Exam   Blood pressure (!) 95/44, pulse 98, temperature 98.6 F (37 C), temperature source Oral, resp. rate 18, height 5\' 3"  (1.6 m), weight 165 lb (74.8 kg), last menstrual period 02/27/2017, SpO2 98 %.  Physical Exam  Nursing note and vitals reviewed. Constitutional: She is oriented to person, place, and time. She appears well-developed and well-nourished. No distress.  HENT:  Head: Normocephalic.  Eyes: Pupils are equal, round, and reactive to light.  Cardiovascular: Normal rate, regular  rhythm and normal heart sounds.  Respiratory: Effort normal and breath sounds normal. No respiratory distress.  GI: Soft. Bowel sounds are normal. She exhibits no distension. There is no tenderness.  Neurological: She is alert and oriented to person, place, and time.  Skin: Skin is warm and dry.  Psychiatric: She has a normal mood and affect. Her behavior is normal. Judgment and thought content normal.   Fetal Tracing:  Baseline: 145 Variability: moderate Accels:15x15 Decels: none  Toco: irregular uc's  MAU Course  Procedures Results for orders placed or performed during the hospital encounter of 10/10/17 (from the past 24 hour(s))  Urinalysis, Routine w reflex microscopic     Status: Abnormal   Collection Time: 10/10/17 10:47 PM  Result Value Ref Range   Color, Urine AMBER (A) YELLOW   APPearance HAZY (A) CLEAR   Specific Gravity, Urine 1.029 1.005 - 1.030   pH 5.0 5.0 - 8.0   Glucose, UA NEGATIVE NEGATIVE mg/dL   Hgb urine dipstick NEGATIVE NEGATIVE   Bilirubin Urine SMALL (A) NEGATIVE   Ketones, ur 80 (A) NEGATIVE mg/dL   Protein, ur 161 (A) NEGATIVE mg/dL   Nitrite NEGATIVE NEGATIVE   Leukocytes, UA NEGATIVE NEGATIVE   RBC / HPF 0-5 0 - 5 RBC/hpf   WBC, UA 0-5 0 - 5 WBC/hpf   Bacteria, UA NONE SEEN NONE SEEN   Squamous Epithelial / LPF 0-5 (A) NONE SEEN   Mucus PRESENT    Granular Casts, UA PRESENT   Influenza panel by PCR (type A & B)     Status: Abnormal   Collection Time: 10/10/17 11:10 PM  Result Value Ref Range   Influenza A By PCR POSITIVE (A) NEGATIVE   Influenza B By PCR NEGATIVE NEGATIVE     MDM Influenza PCR LR bolus Tamiflu  Assessment and Plan   1. Influenza A   2. [redacted] weeks gestation of pregnancy    -Discharge home in stable condition -Rx for tamiflu given to patient -Influenza precautions discussed -Patient advised to follow-up with [facility] as scheduled for prenatal care -Patient may return to MAU as needed or if her condition were to  change or worsen   Rolm Bookbinder CNM 10/10/2017, 11:18 PM

## 2017-10-11 ENCOUNTER — Telehealth: Payer: Self-pay | Admitting: *Deleted

## 2017-10-11 DIAGNOSIS — Z3A34 34 weeks gestation of pregnancy: Secondary | ICD-10-CM

## 2017-10-11 DIAGNOSIS — O9989 Other specified diseases and conditions complicating pregnancy, childbirth and the puerperium: Secondary | ICD-10-CM

## 2017-10-11 DIAGNOSIS — J101 Influenza due to other identified influenza virus with other respiratory manifestations: Secondary | ICD-10-CM

## 2017-10-11 LAB — INFLUENZA PANEL BY PCR (TYPE A & B)
Influenza A By PCR: POSITIVE — AB
Influenza B By PCR: NEGATIVE

## 2017-10-11 MED ORDER — OSELTAMIVIR PHOSPHATE 75 MG PO CAPS: 75.0000 mg | ORAL_CAPSULE | Freq: Two times a day (BID) | ORAL | 0 refills | Status: DC

## 2017-10-11 MED ORDER — OSELTAMIVIR PHOSPHATE 75 MG PO CAPS
75.0000 mg | ORAL_CAPSULE | Freq: Once | ORAL | Status: AC
  Administered 2017-10-11: 75 mg via ORAL
  Filled 2017-10-11: qty 1

## 2017-10-11 NOTE — Discharge Instructions (Signed)

## 2017-10-11 NOTE — MAU Note (Signed)
Lab called to notify of results: Flu A positive. Provider notified.

## 2017-10-12 ENCOUNTER — Other Ambulatory Visit: Payer: Self-pay

## 2017-10-12 ENCOUNTER — Inpatient Hospital Stay (HOSPITAL_COMMUNITY): Payer: Medicaid Other

## 2017-10-12 ENCOUNTER — Inpatient Hospital Stay (HOSPITAL_COMMUNITY)
Admission: AD | Admit: 2017-10-12 | Discharge: 2017-10-12 | Disposition: A | Discharge status: Home / Self Care | Payer: Medicaid Other | Source: Ambulatory Visit | Attending: Obstetrics and Gynecology | Admitting: Obstetrics and Gynecology

## 2017-10-12 ENCOUNTER — Encounter (HOSPITAL_COMMUNITY): Payer: Self-pay | Admitting: *Deleted

## 2017-10-12 ENCOUNTER — Telehealth: Payer: Self-pay | Admitting: *Deleted

## 2017-10-12 DIAGNOSIS — R112 Nausea with vomiting, unspecified: Secondary | ICD-10-CM | POA: Diagnosis present

## 2017-10-12 DIAGNOSIS — Z87891 Personal history of nicotine dependence: Secondary | ICD-10-CM | POA: Insufficient documentation

## 2017-10-12 DIAGNOSIS — J101 Influenza due to other identified influenza virus with other respiratory manifestations: Secondary | ICD-10-CM | POA: Insufficient documentation

## 2017-10-12 DIAGNOSIS — Z3A34 34 weeks gestation of pregnancy: Secondary | ICD-10-CM | POA: Insufficient documentation

## 2017-10-12 DIAGNOSIS — O26893 Other specified pregnancy related conditions, third trimester: Secondary | ICD-10-CM | POA: Diagnosis present

## 2017-10-12 DIAGNOSIS — O219 Vomiting of pregnancy, unspecified: Secondary | ICD-10-CM | POA: Diagnosis not present

## 2017-10-12 DIAGNOSIS — O212 Late vomiting of pregnancy: Secondary | ICD-10-CM | POA: Insufficient documentation

## 2017-10-12 DIAGNOSIS — O99513 Diseases of the respiratory system complicating pregnancy, third trimester: Secondary | ICD-10-CM | POA: Diagnosis not present

## 2017-10-12 DIAGNOSIS — R000 Tachycardia, unspecified: Secondary | ICD-10-CM | POA: Insufficient documentation

## 2017-10-12 LAB — URINALYSIS, ROUTINE W REFLEX MICROSCOPIC
Glucose, UA: NEGATIVE mg/dL
Hgb urine dipstick: NEGATIVE
KETONES UR: 80 mg/dL — AB
Leukocytes, UA: NEGATIVE
Nitrite: NEGATIVE
PROTEIN: 100 mg/dL — AB
SPECIFIC GRAVITY, URINE: 1.026 (ref 1.005–1.030)
pH: 5 (ref 5.0–8.0)

## 2017-10-12 MED ORDER — M.V.I. ADULT IV INJ
Freq: Once | INTRAVENOUS | Status: DC
  Filled 2017-10-12: qty 1000

## 2017-10-12 MED ORDER — LACTATED RINGERS IV BOLUS (SEPSIS)
1000.0000 mL | Freq: Once | INTRAVENOUS | Status: AC
  Administered 2017-10-12: 1000 mL via INTRAVENOUS

## 2017-10-12 MED ORDER — SODIUM CHLORIDE 0.9 % IV SOLN
8.0000 mg | Freq: Once | INTRAVENOUS | Status: AC
  Administered 2017-10-12: 8 mg via INTRAVENOUS
  Filled 2017-10-12: qty 4

## 2017-10-12 MED ORDER — FAMOTIDINE IN NACL 20-0.9 MG/50ML-% IV SOLN
20.0000 mg | Freq: Once | INTRAVENOUS | Status: AC
  Administered 2017-10-12: 20 mg via INTRAVENOUS
  Filled 2017-10-12: qty 50

## 2017-10-12 MED ORDER — PROMETHAZINE HCL 25 MG/ML IJ SOLN: 12.5000 mg | Freq: Four times a day (QID) | INTRAMUSCULAR | Status: DC | PRN

## 2017-10-12 MED ORDER — SCOPOLAMINE 1 MG/3DAYS TD PT72: 1.0000 | MEDICATED_PATCH | TRANSDERMAL | 0 refills | Status: DC

## 2017-10-12 MED ORDER — ONDANSETRON 8 MG PO TBDP: 8.0000 mg | ORAL_TABLET | Freq: Three times a day (TID) | ORAL | 0 refills | Status: DC | PRN

## 2017-10-12 NOTE — MAU Note (Signed)
Hasn't been able to keep anything down last few days. Taking meds for nausea, not helping.  Dx with the flu, throwing up all her meds. Can keep down ice only.  Burning in upper abd, started 2 days ago.

## 2017-10-12 NOTE — Discharge Instructions (Signed)
Cool Mist Vaporizer A cool mist vaporizer is a device that releases a cool mist into the air. If you have a cough or a cold, using a vaporizer may help relieve your symptoms. The mist adds moisture to the air, which may help thin your mucus and make it less sticky. When your mucus is thin and less sticky, it easier for you to breathe and to cough up secretions. Do not use a vaporizer if you are allergic to mold. Follow these instructions at home:  Follow the instructions that come with the vaporizer.  Do not use anything other than distilled water in the vaporizer.  Do not run the vaporizer all of the time. Doing that can cause mold or bacteria to grow in the vaporizer.  Clean the vaporizer after each time that you use it.  Clean and dry the vaporizer well before storing it.  Stop using the vaporizer if your breathing symptoms get worse. This information is not intended to replace advice given to you by your health care provider. Make sure you discuss any questions you have with your health care provider. Document Released: 04/22/2004 Document Revised: 02/13/2016 Document Reviewed: 10/25/2015 Elsevier Interactive Patient Education  2018 [employer]  

## 2017-10-12 NOTE — Telephone Encounter (Signed)
Pt said that she felt like she was dehydrated, vomiting a lot, and lips are very dry.  She said that she has not been using the bathroom much at all.  Pt said that she has the flu.  Pt wanted to know if she needed to go to the hospital.  Discussed with Tish and pt [facility].  10-12-17  AS

## 2017-10-12 NOTE — MAU Provider Note (Signed)
History     CSN: [id number]  Arrival date and time: 10/12/17 1653   None     Chief Complaint  Patient presents with  . Emesis   HPI   Tiffany [name] is a 22 y.o. female G2P0010 @ 3723w4d here with nausea, vomiting and congestion. Says she was diagnosed with the flu over the weekend. Says she has been taking her Tamiflu however has not been able to keep down food or fluids since diagnosed. Feels very dehydrated.  States she has vomited so many times that she cannot keep track. No fever, no diarrhea. + fetal movement. + burning in the upper part of her abdomen after vomiting. Has tried phenergan however is not working.   OB History    Gravida Para Term Preterm AB Living   2       1     SAB TAB Ectopic Multiple Live Births   1              Past Medical History:  Diagnosis Date  . Anxiety   . Asthma   . Bronchitis in child   . Depression   . Eczema     Past Surgical History:  Procedure Laterality Date  . WISDOM TOOTH EXTRACTION      Family History  Problem Relation Age of Onset  . COPD Mother   . Diabetes Mother        pre-diabetes  . Other Mother        boils  . COPD Paternal Grandfather   . Diabetes Paternal Grandmother   . COPD Maternal Grandmother   . Thyroid disease Maternal Grandmother   . Aneurysm Maternal Grandfather   . Thyroid disease Maternal Aunt     Social History   Tobacco Use  . Smoking status: Former Smoker    Years: 1.00    Types: Cigarettes  . Smokeless tobacco: Never Used  Substance Use Topics  . Alcohol use: No  . Drug use: Yes    Types: Marijuana    Comment: daily;"helps with nausea"    Allergies: No Known Allergies  Medications Prior to Admission  Medication Sig Dispense Refill Last Dose  . acetaminophen (TYLENOL) 160 MG/5ML liquid Take by mouth every 4 (four) hours as needed for fever.   10/10/2017 at 1900  . albuterol (PROVENTIL HFA;VENTOLIN HFA) 108 (90 Base) MCG/ACT inhaler Inhale into the lungs every 6 (six) hours as  needed for wheezing or shortness of breath.   More than a month at Unknown time  . azithromycin (ZITHROMAX) 250 MG tablet Take 1 tablet (250 mg total) by mouth daily. Take 2 today, the 1 a day for 4 days 6 tablet 0 10/09/2017 at Unknown time  . cephALEXin (KEFLEX) 500 MG capsule Take 1 capsule (500 mg total) by mouth 3 (three) times daily. 30 capsule 4 Past Week at Unknown time  . flintstones complete (FLINTSTONES) 60 MG chewable tablet Take 2 daily   10/09/2017 at Unknown time  . oseltamivir (TAMIFLU) 75 MG capsule Take 1 capsule (75 mg total) by mouth every 12 (twelve) hours. 10 capsule 0   . promethazine (PHENERGAN) 25 MG tablet TAKE 1 TABLET(25 MG) BY MOUTH EVERY 6 HOURS AS NEEDED FOR NAUSEA 30 tablet 0 10/10/2017 at Unknown time  . triamcinolone cream (KENALOG) 0.1 % Apply 1 application topically 2 (two) times daily. (Patient not taking: Reported on 09/29/2017) 454 g 2 Not Taking   Results for orders placed or performed during the hospital encounter of 10/12/17 (from  the past 48 hour(s))  Urinalysis, Routine w reflex microscopic     Status: Abnormal   Collection Time: 10/12/17  5:09 PM  Result Value Ref Range   Color, Urine YELLOW YELLOW   APPearance HAZY (A) CLEAR   Specific Gravity, Urine 1.026 1.005 - 1.030   pH 5.0 5.0 - 8.0   Glucose, UA NEGATIVE NEGATIVE mg/dL   Hgb urine dipstick NEGATIVE NEGATIVE   Bilirubin Urine SMALL (A) NEGATIVE   Ketones, ur 80 (A) NEGATIVE mg/dL   Protein, ur 409 (A) NEGATIVE mg/dL   Nitrite NEGATIVE NEGATIVE   Leukocytes, UA NEGATIVE NEGATIVE   RBC / HPF 0-5 0 - 5 RBC/hpf   WBC, UA 0-5 0 - 5 WBC/hpf   Bacteria, UA RARE (A) NONE SEEN   Squamous Epithelial / LPF 6-30 (A) NONE SEEN   Mucus PRESENT    Hyaline Casts, UA PRESENT    Granular Casts, UA PRESENT     Comment: Performed at [facility], [address] [place], [place] [address]   Dg Chest 2 View  Result Date: 10/12/2017 CLINICAL DATA:  Chest pain and cough EXAM: CHEST - 2 VIEW COMPARISON:   06/16/2017 FINDINGS: Low lung volumes. Bandlike opacity overlying the heart on lateral view, suspect subsegmental atelectasis. Normal heart size. No pneumothorax. IMPRESSION: No active cardiopulmonary disease. Probable subsegmental atelectasis on the lateral view. Electronically Signed   By: Jasmine Pang M.D.   On: 10/12/2017 19:25   Review of Systems  Constitutional: Negative for fever.  Gastrointestinal: Positive for abdominal pain (Upper abdominal pain only), nausea and vomiting.   Physical Exam   Blood pressure 107/64, pulse (!) 112, temperature 98.4 F (36.9 C), temperature source Oral, resp. rate 16, weight 162 lb 8 oz (73.7 kg), last menstrual period 02/27/2017, SpO2 100 %.  Physical Exam  Constitutional: She is oriented to person, place, and time. She appears well-developed and well-nourished.  Non-toxic appearance. She has a sickly appearance. No distress.  Eyes: Pupils are equal, round, and reactive to light.  Cardiovascular: Tachycardia present.  Respiratory: Effort normal and breath sounds normal. No respiratory distress. She has no wheezes. She has no rales. She exhibits no tenderness.  GI: Soft. She exhibits no distension and no mass. There is no tenderness. There is no rebound and no guarding.  Musculoskeletal: Normal range of motion.  Neurological: She is alert and oriented to person, place, and time.  Skin: Skin is warm and dry. She is not diaphoretic. There is pallor.  Psychiatric: Her behavior is normal.    Fetal Tracing: Baseline: 135 bpm Variability: Moderate  Accelerations: 15x15 Decelerations: none Toco: quiet, none  MAU Course  Procedures None  MDM  LR bolus X 1 MVI bolus X 1 Pepcid, Zofran and Phenergan given Chest Xray due to maternal tachycardia in the 130's.  Chest xray negative, HR down following IVF. Patient tolerating oral fluids and crackers.  Urine culture pending  Pulse ox 100% on RA Assessment and Plan   A:  1. Nausea and vomiting in  pregnancy   2. Tachycardia   3. Influenza A     P:  Discharge home with strict return precautions Return to MAU if symptoms worsen Rx: Zofran ODT, Scopolamine patch  Follow up with OB as scheduled BRAT diet Increase oral fluids  Rasch, Harolyn Rutherford, NP 10/13/2017 8:44 AM

## 2017-10-12 NOTE — Telephone Encounter (Signed)
Opened in error

## 2017-10-13 ENCOUNTER — Encounter: Payer: Medicaid Other | Admitting: Women's Health

## 2017-10-14 LAB — CULTURE, OB URINE: SPECIAL REQUESTS: NORMAL

## 2017-10-18 ENCOUNTER — Encounter: Payer: Medicaid Other | Admitting: Obstetrics and Gynecology

## 2017-10-26 ENCOUNTER — Ambulatory Visit (INDEPENDENT_AMBULATORY_CARE_PROVIDER_SITE_OTHER): Payer: Medicaid Other | Admitting: Women's Health

## 2017-10-26 ENCOUNTER — Telehealth: Payer: Self-pay | Admitting: *Deleted

## 2017-10-26 ENCOUNTER — Encounter: Payer: Self-pay | Admitting: Women's Health

## 2017-10-26 VITALS — BP 118/80 | HR 86 | Wt 167.5 lb

## 2017-10-26 DIAGNOSIS — Z3403 Encounter for supervision of normal first pregnancy, third trimester: Secondary | ICD-10-CM

## 2017-10-26 DIAGNOSIS — Z3A36 36 weeks gestation of pregnancy: Secondary | ICD-10-CM

## 2017-10-26 DIAGNOSIS — Z3483 Encounter for supervision of other normal pregnancy, third trimester: Secondary | ICD-10-CM

## 2017-10-26 DIAGNOSIS — Z1389 Encounter for screening for other disorder: Secondary | ICD-10-CM

## 2017-10-26 DIAGNOSIS — Z331 Pregnant state, incidental: Secondary | ICD-10-CM

## 2017-10-26 LAB — POCT URINALYSIS DIPSTICK
Blood, UA: NEGATIVE
Glucose, UA: NEGATIVE
Ketones, UA: NEGATIVE
LEUKOCYTES UA: NEGATIVE
NITRITE UA: NEGATIVE
PROTEIN UA: NEGATIVE

## 2017-10-26 NOTE — Addendum Note (Signed)
Addended by: Colen DarlingYOUNG, Mikinzie Maciejewski S on: 10/26/2017 04:15 PM   Modules accepted: Orders

## 2017-10-26 NOTE — Patient Instructions (Signed)
Long[facility] Drug StoresSavannah R [name], I greatly value your feedback.  If you receive a survey following your visit with us today, we appreciate you taking the time to fill it out.  Thanks, Joellyn HaffKim Keishon Chavarin, CNM, WHNP-BC   Call the office [phone]) or go to [facility] if:  You begin to have strong, frequent contractions  Your water breaks.  Sometimes it is a big gush of fluid, sometimes it is just a trickle that keeps getting your panties wet or running down your legs  You have vaginal bleeding.  It is normal to have a small amount of spotting if your cervix was checked.   You don't feel your baby moving like normal.  If you don't, get you something to eat and drink and lay down and focus on feeling your baby move.  You should feel at least 10 movements in 2 hours.  If you don't, you should call the office or go to [facility].    Tdap Vaccine  It is recommended that you get the Tdap vaccine during the third trimester of EACH pregnancy to help protect your baby from getting pertussis (whooping cough)  27-36 weeks is the BEST time to do this so that you can pass the protection on to your baby. During pregnancy is better than after pregnancy, but if you are unable to get it during pregnancy it will be offered at the hospital.   You can get this vaccine at the health department or your family doctor  Everyone who will be around your baby should also be up-to-date on their vaccines. Adults (who are not pregnant) only need 1 dose of Tdap during adulthood.   Third Trimester of Pregnancy The third trimester is from week 29 through week 42, months 7 through 9. The third trimester is a time when the fetus is growing rapidly. At the end of the ninth month, the fetus is about 20 inches in length and weighs 6-10 pounds.  BODY CHANGES Your body goes through many changes during pregnancy. The changes vary from woman to woman.   Your weight will continue to increase. You can expect to gain 25-35 pounds (11-16 kg) by  the end of the pregnancy.  You may begin to get stretch marks on your hips, abdomen, and breasts.  You may urinate more often because the fetus is moving lower into your pelvis and pressing on your bladder.  You may develop or continue to have heartburn as a result of your pregnancy.  You may develop constipation because certain hormones are causing the muscles that push waste through your intestines to slow down.  You may develop hemorrhoids or swollen, bulging veins (varicose veins).  You may have pelvic pain because of the weight gain and pregnancy hormones relaxing your joints between the bones in your pelvis. Backaches may result from overexertion of the muscles supporting your posture.  You may have changes in your hair. These can include thickening of your hair, rapid growth, and changes in texture. Some women also have hair loss during or after pregnancy, or hair that feels dry or thin. Your hair will most likely return to normal after your baby is born.  Your breasts will continue to grow and be tender. A yellow discharge may leak from your breasts called colostrum.  Your belly button may stick out.  You may feel short of breath because of your expanding uterus.  You may notice the fetus "dropping," or moving lower in your abdomen.  You may have a bloody  mucus discharge. This usually occurs a few days to a week before labor begins.  Your cervix becomes thin and soft (effaced) near your due date. WHAT TO EXPECT AT YOUR PRENATAL EXAMS  You will have prenatal exams every 2 weeks until week 36. Then, you will have weekly prenatal exams. During a routine prenatal visit:  You will be weighed to make sure you and the fetus are growing normally.  Your blood pressure is taken.  Your abdomen will be measured to track your baby's growth.  The fetal heartbeat will be listened to.  Any test results from the previous visit will be discussed.  You may have a cervical check near your  due date to see if you have effaced. At around 36 weeks, your caregiver will check your cervix. At the same time, your caregiver will also perform a test on the secretions of the vaginal tissue. This test is to determine if a type of bacteria, Group B streptococcus, is present. Your caregiver will explain this further. Your caregiver may ask you:  What your birth plan is.  How you are feeling.  If you are feeling the baby move.  If you have had any abnormal symptoms, such as leaking fluid, bleeding, severe headaches, or abdominal cramping.  If you have any questions. Other tests or screenings that may be performed during your third trimester include:  Blood tests that check for low iron levels (anemia).  Fetal testing to check the health, activity level, and growth of the fetus. Testing is done if you have certain medical conditions or if there are problems during the pregnancy. FALSE LABOR You may feel small, irregular contractions that eventually go away. These are called Braxton Hicks contractions, or false labor. Contractions may last for hours, days, or even weeks before true labor sets in. If contractions come at regular intervals, intensify, or become painful, it is best to be seen by your caregiver.  SIGNS OF LABOR   Menstrual-like cramps.  Contractions that are 5 minutes apart or less.  Contractions that start on the top of the uterus and spread down to the lower abdomen and back.  A sense of increased pelvic pressure or back pain.  A watery or bloody mucus discharge that comes from the vagina. If you have any of these signs before the 37th week of pregnancy, call your caregiver right away. You need to go to the hospital to get checked immediately. HOME CARE INSTRUCTIONS   Avoid all smoking, herbs, alcohol, and unprescribed drugs. These chemicals affect the formation and growth of the baby.  Follow your caregiver's instructions regarding medicine use. There are medicines  that are either safe or unsafe to take during pregnancy.  Exercise only as directed by your caregiver. Experiencing uterine cramps is a good sign to stop exercising.  Continue to eat regular, healthy meals.  Wear a good support bra for breast tenderness.  Do not use hot tubs, steam rooms, or saunas.  Wear your seat belt at all times when driving.  Avoid raw meat, uncooked cheese, cat litter boxes, and soil used by cats. These carry germs that can cause birth defects in the baby.  Take your prenatal vitamins.  Try taking a stool softener (if your caregiver approves) if you develop constipation. Eat more high-fiber foods, such as fresh vegetables or fruit and whole grains. Drink plenty of fluids to keep your urine clear or pale yellow.  Take warm sitz baths to soothe any pain or discomfort caused by hemorrhoids.  Use hemorrhoid cream if your caregiver approves.  If you develop varicose veins, wear support hose. Elevate your feet for 15 minutes, 3-4 times a day. Limit salt in your diet.  Avoid heavy lifting, wear low heal shoes, and practice good posture.  Rest a lot with your legs elevated if you have leg cramps or low back pain.  Visit your dentist if you have not gone during your pregnancy. Use a soft toothbrush to brush your teeth and be gentle when you floss.  A sexual relationship may be continued unless your caregiver directs you otherwise.  Do not travel far distances unless it is absolutely necessary and only with the approval of your caregiver.  Take prenatal classes to understand, practice, and ask questions about the labor and delivery.  Make a trial run to the hospital.  Pack your hospital bag.  Prepare the baby's nursery.  Continue to go to all your prenatal visits as directed by your caregiver. SEEK MEDICAL CARE IF:  You are unsure if you are in labor or if your water has broken.  You have dizziness.  You have mild pelvic cramps, pelvic pressure, or nagging  pain in your abdominal area.  You have persistent nausea, vomiting, or diarrhea.  You have a bad smelling vaginal discharge.  You have pain with urination. SEEK IMMEDIATE MEDICAL CARE IF:   You have a fever.  You are leaking fluid from your vagina.  You have spotting or bleeding from your vagina.  You have severe abdominal cramping or pain.  You have rapid weight loss or gain.  You have shortness of breath with chest pain.  You notice sudden or extreme swelling of your face, hands, ankles, feet, or legs.  You have not felt your baby move in over an hour.  You have severe headaches that do not go away with medicine.  You have vision changes. Document Released: 07/20/2001 Document Revised: 07/31/2013 Document Reviewed: 09/26/2012 ExitCare Patient Information 2015 [employer]. This information is not intended to replace advice given to you by your health care provider. Make sure you discuss any questions you have with your health care provider.   
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
## 2017-10-27 ENCOUNTER — Telehealth: Payer: Self-pay | Admitting: Obstetrics & Gynecology

## 2017-10-27 MED ORDER — PROMETHAZINE HCL 25 MG PO TABS: 12.5000 mg | ORAL_TABLET | Freq: Four times a day (QID) | ORAL | 0 refills | Status: DC | PRN

## 2017-10-27 NOTE — Telephone Encounter (Signed)
Informed pt that refill for phenergan had been sent to [facility] in [place].

## 2017-11-02 ENCOUNTER — Encounter: Payer: Medicaid Other | Admitting: Advanced Practice Midwife

## 2017-11-10 ENCOUNTER — Encounter: Payer: Medicaid Other | Admitting: Women's Health

## 2017-11-16 ENCOUNTER — Encounter: Payer: Medicaid Other | Admitting: Advanced Practice Midwife

## 2017-11-21 ENCOUNTER — Ambulatory Visit (INDEPENDENT_AMBULATORY_CARE_PROVIDER_SITE_OTHER): Payer: Medicaid Other | Admitting: Obstetrics & Gynecology

## 2017-11-21 ENCOUNTER — Encounter: Payer: Self-pay | Admitting: Obstetrics & Gynecology

## 2017-11-21 ENCOUNTER — Other Ambulatory Visit: Payer: Self-pay

## 2017-11-21 VITALS — BP 112/64 | HR 101 | Wt 165.0 lb

## 2017-11-21 DIAGNOSIS — Z3A40 40 weeks gestation of pregnancy: Secondary | ICD-10-CM

## 2017-11-21 DIAGNOSIS — Z1389 Encounter for screening for other disorder: Secondary | ICD-10-CM

## 2017-11-21 DIAGNOSIS — O480 Post-term pregnancy: Secondary | ICD-10-CM

## 2017-11-21 DIAGNOSIS — Z331 Pregnant state, incidental: Secondary | ICD-10-CM

## 2017-11-21 DIAGNOSIS — Z3483 Encounter for supervision of other normal pregnancy, third trimester: Secondary | ICD-10-CM

## 2017-11-21 LAB — POCT URINALYSIS DIPSTICK
GLUCOSE UA: NEGATIVE
Ketones, UA: NEGATIVE
LEUKOCYTES UA: NEGATIVE
Nitrite, UA: NEGATIVE
RBC UA: NEGATIVE

## 2017-11-21 NOTE — Progress Notes (Signed)
G2P0010 4045w2d Estimated Date of Delivery: 11/19/17  Blood pressure 112/64, pulse (!) 101, weight 165 lb (74.8 kg), last menstrual period 02/27/2017.   BP weight and urine results all reviewed and noted.  Please refer to the obstetrical flow sheet for the fundal height and fetal heart rate documentation:  Patient reports good fetal movement, denies any bleeding and no rupture of membranes symptoms or regular contractions. Patient is without complaints. All questions were answered.  Orders Placed This Encounter  Procedures  . POCT urinalysis dipstick    Plan:  Continued routine obstetrical care, pt declines exam, GBS culture today Pt states she will come in tomorrow for a GBS culture and cervical exam with Joellyn HaffKim Booker CNM  Return in about 1 day (around 11/22/2017).

## 2017-11-22 ENCOUNTER — Ambulatory Visit (INDEPENDENT_AMBULATORY_CARE_PROVIDER_SITE_OTHER): Payer: Medicaid Other | Admitting: Women's Health

## 2017-11-22 ENCOUNTER — Encounter: Payer: Self-pay | Admitting: Women's Health

## 2017-11-22 VITALS — BP 100/70 | HR 95 | Wt 166.0 lb

## 2017-11-22 DIAGNOSIS — O26843 Uterine size-date discrepancy, third trimester: Secondary | ICD-10-CM

## 2017-11-22 DIAGNOSIS — Z3403 Encounter for supervision of normal first pregnancy, third trimester: Secondary | ICD-10-CM

## 2017-11-22 DIAGNOSIS — Z3483 Encounter for supervision of other normal pregnancy, third trimester: Secondary | ICD-10-CM

## 2017-11-22 DIAGNOSIS — Z3A40 40 weeks gestation of pregnancy: Secondary | ICD-10-CM | POA: Diagnosis not present

## 2017-11-22 DIAGNOSIS — O480 Post-term pregnancy: Secondary | ICD-10-CM

## 2017-11-22 NOTE — Addendum Note (Signed)
Addended by: Cheral MarkerBOOKER, Ashawna Hanback R on: 11/22/2017 05:10 PM   Modules accepted: Orders

## 2017-11-22 NOTE — Patient Instructions (Signed)
Your induction is scheduled for 4/19 @ 11:45pm. Go to [facility] hospital, Maternity Admissions Unit (Emergency) entrance and let them know you are there to be induced. They will send someone from Labor & Delivery to come get you.    [employer], I greatly value your feedback.  If you receive a survey following your visit with [place] today, we appreciate you taking the time to fill it out.  Thanks, [name], CNM, WHNP-BC   Call the office [phone]) or go to [facility] if:  You begin to have strong, frequent contractions  Your water breaks.  Sometimes it is a big gush of fluid, sometimes it is just a trickle that keeps getting your panties wet or running down your legs  You have vaginal bleeding.  It is normal to have a small amount of spotting if your cervix was checked.   You don't feel your baby moving like normal.  If you don't, get you something to eat and drink and lay down and focus on feeling your baby move.  You should feel at least 10 movements in 2 hours.  If you don't, you should call the office or go to [facility].     [facility] Contractions Contractions of the uterus can occur throughout pregnancy, but they are not always a sign that you are in labor. You may have practice contractions called Braxton Hicks contractions. These false labor contractions are sometimes confused with true labor. What are Deberah Pelton contractions? Braxton Hicks contractions are tightening movements that occur in the muscles of the uterus before labor. Unlike true labor contractions, these contractions do not result in opening (dilation) and thinning of the cervix. Toward the end of pregnancy (32-34 weeks), Braxton Hicks contractions can happen more often and may become stronger. These contractions are sometimes difficult to tell apart from true labor because they can be very uncomfortable. You should not feel embarrassed if you go to the hospital with false labor. Sometimes, the only way  to tell if you are in true labor is for your health care provider to look for changes in the cervix. The health care provider will do a physical exam and may monitor your contractions. If you are not in true labor, the exam should show that your cervix is not dilating and your water has not broken. If there are other health problems associated with your pregnancy, it is completely safe for you to be sent home with false labor. You may continue to have Braxton Hicks contractions until you go into true labor. How to tell the difference between true labor and false labor True labor  Contractions last 30-70 seconds.  Contractions become very regular.  Discomfort is usually felt in the top of the uterus, and it spreads to the lower abdomen and low back.  Contractions do not go away with walking.  Contractions usually become more intense and increase in frequency.  The cervix dilates and gets thinner. False labor  Contractions are usually shorter and not as strong as true labor contractions.  Contractions are usually irregular.  Contractions are often felt in the front of the lower abdomen and in the groin.  Contractions may go away when you walk around or change positions while lying down.  Contractions get weaker and are shorter-lasting as time goes on.  The cervix usually does not dilate or become thin. Follow these instructions at home:  Take over-the-counter and prescription medicines only as told by your health care provider.  Keep  up with your usual exercises and follow other instructions from your health care provider.  Eat and drink lightly if you think you are going into labor.  If Braxton Hicks contractions are making you uncomfortable: ? Change your position from lying down or resting to walking, or change from walking to resting. ? Sit and rest in a tub of warm water. ? Drink enough fluid to keep your urine pale yellow. Dehydration may cause these contractions. ? Do  slow and deep breathing several times an hour.  Keep all follow-up prenatal visits as told by your health care provider. This is important. Contact a health care provider if:  You have a fever.  You have continuous pain in your abdomen. Get help right away if:  Your contractions become stronger, more regular, and closer together.  You have fluid leaking or gushing from your vagina.  You pass blood-tinged mucus (bloody show).  You have bleeding from your vagina.  You have low back pain that you never had before.  You feel your baby's head pushing down and causing pelvic pressure.  Your baby is not moving inside you as much as it used to. Summary  Contractions that occur before labor are called Braxton Hicks contractions, false labor, or practice contractions.  Braxton Hicks contractions are usually shorter, weaker, farther apart, and less regular than true labor contractions. True labor contractions usually become progressively stronger and regular and they become more frequent.  Manage discomfort from [facility] contractions by changing position, resting in a warm bath, drinking plenty of water, or practicing deep breathing. This information is not intended to replace advice given to you by your health care provider. Make sure you discuss any questions you have with your health care provider. Document Released: 12/09/2016 Document Revised: 12/09/2016 Document Reviewed: 12/09/2016 Elsevier Interactive Patient Education  2018 [employer]

## 2017-11-22 NOTE — Progress Notes (Addendum)
   LOW-RISK PREGNANCY VISIT Patient name: Tiffany [name] MRN [id number]  Date of birth: Dec 11, 1995 Chief Complaint:   low risk ob (GBS/ GC/ CHL/ unable void)  History of Present Illness:   Tiffany MyronSavannah R Plocher is a 22 y.o. 152P0010 female at 5313w3d with an Estimated Date of Delivery: 11/19/17 being seen today for ongoing management of a low-risk pregnancy.  Today she reports no complaints. Contractions: Not present.  .  Movement: Present. denies leaking of fluid. Hasn't done gbs yet, waiting on female provider.  Review of Systems:   Pertinent items are noted in HPI Denies abnormal vaginal discharge w/ itching/odor/irritation, headaches, visual changes, shortness of breath, chest pain, abdominal pain, severe nausea/vomiting, or problems with urination or bowel movements unless otherwise stated above. Pertinent History Reviewed:  Reviewed past medical,surgical, social, obstetrical and family history.  Reviewed problem list, medications and allergies. Physical Assessment:   Vitals:   11/22/17 1452  BP: 100/70  Pulse: 95  Weight: 166 lb (75.3 kg)  Body mass index is 29.41 kg/m.        Physical Examination:   General appearance: Well appearing, and in no distress  Mental status: Alert, oriented to person, place, and time  Skin: Warm & dry  Cardiovascular: Normal heart rate noted  Respiratory: Normal respiratory effort, no distress  Abdomen: Soft, gravid, nontender  Pelvic: Cervical exam deferred         Extremities: Edema: None  Fetal Status: Fetal Heart Rate (bpm): 143 Fundal Height: 33 cm Movement: Present Presentation: Vertex, declines SVE  No results found for this or any previous visit (from the past 24 hour(s)).  Assessment & Plan:  1) Low-risk pregnancy G2P0010 at 8713w3d with an Estimated Date of Delivery: 11/19/17   2) Uterine size < dates, will get efw/afi u/s   Meds: No orders of the defined types were placed in this encounter.  Labs/procedures today: gbs, gc/ct  Plan:  U/s  tomorrow for s<d, IOL scheduled for 4/20 @ [facility] for postdates   Reviewed: Term labor symptoms and general obstetric precautions including but not limited to vaginal bleeding, contractions, leaking of fluid and fetal movement were reviewed in detail with the patient.  All questions were answered  Follow-up: Return for tomorrow at 2:30 for efw/afi u/s.  Orders Placed This Encounter  Procedures  . Strep Gp B NAA  . GC/Chlamydia Probe Amp  . US OB Follow Up  . US FETAL BPP WO NON STRESS   Cheral MarkerKimberly R Booker CNM, [facility]NP[facility]BC 11/22/2017 5:10 PM

## 2017-11-22 NOTE — Treatment Plan (Signed)
Induction Assessment Scheduling Form Fax to [facility]:  [phone]  Phill MyronSavannah R Barrick                                                                                   DOB:  22-Dec-1995                                                            MRN:  [id number]                                                                     Phone #:   [phone]                         [facility]  GP:  [id number]                                                            Estimated Date of Delivery: 11/19/17  Dating Criteria: 8wk u/s    Medical Indications for induction:  postdates Admission Date/Time:  4/20 @ [facility] Gestational age on admission:  5741.0   Filed Weights   11/22/17 1452  Weight: 166 lb (75.3 kg)   HIV:  Non Reactive (01/31 0901) GBS:  pending  SVE: declined   Method of induction(proposed):  TBD, will plan cytotec   Scheduling Provider Signature:  Cheral MarkerKimberly R Elmer Merwin, CNM                                            Today's Date:  11/22/2017

## 2017-11-23 ENCOUNTER — Other Ambulatory Visit: Payer: Self-pay | Admitting: Women's Health

## 2017-11-23 ENCOUNTER — Telehealth (HOSPITAL_COMMUNITY): Payer: Self-pay | Admitting: *Deleted

## 2017-11-23 ENCOUNTER — Encounter (HOSPITAL_COMMUNITY): Payer: Self-pay

## 2017-11-23 ENCOUNTER — Inpatient Hospital Stay (HOSPITAL_COMMUNITY)
Admission: AD | Admit: 2017-11-23 | Discharge: 2017-11-26 | DRG: 806 | Disposition: A | Discharge status: Home / Self Care | Payer: Medicaid Other | Source: Ambulatory Visit | Attending: Obstetrics & Gynecology | Admitting: Obstetrics & Gynecology

## 2017-11-23 ENCOUNTER — Ambulatory Visit (INDEPENDENT_AMBULATORY_CARE_PROVIDER_SITE_OTHER): Payer: Medicaid Other

## 2017-11-23 DIAGNOSIS — Z3400 Encounter for supervision of normal first pregnancy, unspecified trimester: Secondary | ICD-10-CM

## 2017-11-23 DIAGNOSIS — F1290 Cannabis use, unspecified, uncomplicated: Secondary | ICD-10-CM | POA: Diagnosis present

## 2017-11-23 DIAGNOSIS — Z3403 Encounter for supervision of normal first pregnancy, third trimester: Secondary | ICD-10-CM

## 2017-11-23 DIAGNOSIS — Z3A40 40 weeks gestation of pregnancy: Secondary | ICD-10-CM

## 2017-11-23 DIAGNOSIS — O26843 Uterine size-date discrepancy, third trimester: Secondary | ICD-10-CM | POA: Diagnosis not present

## 2017-11-23 DIAGNOSIS — Z3490 Encounter for supervision of normal pregnancy, unspecified, unspecified trimester: Secondary | ICD-10-CM | POA: Diagnosis present

## 2017-11-23 DIAGNOSIS — Z87891 Personal history of nicotine dependence: Secondary | ICD-10-CM | POA: Diagnosis not present

## 2017-11-23 DIAGNOSIS — O480 Post-term pregnancy: Secondary | ICD-10-CM | POA: Diagnosis not present

## 2017-11-23 DIAGNOSIS — J45909 Unspecified asthma, uncomplicated: Secondary | ICD-10-CM | POA: Diagnosis present

## 2017-11-23 DIAGNOSIS — O99324 Drug use complicating childbirth: Secondary | ICD-10-CM | POA: Diagnosis present

## 2017-11-23 DIAGNOSIS — O99824 Streptococcus B carrier state complicating childbirth: Secondary | ICD-10-CM | POA: Diagnosis present

## 2017-11-23 DIAGNOSIS — O2302 Infections of kidney in pregnancy, second trimester: Secondary | ICD-10-CM | POA: Diagnosis present

## 2017-11-23 DIAGNOSIS — O9952 Diseases of the respiratory system complicating childbirth: Secondary | ICD-10-CM | POA: Diagnosis present

## 2017-11-23 DIAGNOSIS — O365930 Maternal care for other known or suspected poor fetal growth, third trimester, not applicable or unspecified: Secondary | ICD-10-CM | POA: Diagnosis present

## 2017-11-23 DIAGNOSIS — O365990 Maternal care for other known or suspected poor fetal growth, unspecified trimester, not applicable or unspecified: Secondary | ICD-10-CM | POA: Diagnosis present

## 2017-11-23 LAB — CBC
HEMATOCRIT: 34.4 % — AB (ref 36.0–46.0)
Hemoglobin: 11.5 g/dL — ABNORMAL LOW (ref 12.0–15.0)
MCH: 28.3 pg (ref 26.0–34.0)
MCHC: 33.4 g/dL (ref 30.0–36.0)
MCV: 84.7 fL (ref 78.0–100.0)
PLATELETS: 333 10*3/uL (ref 150–400)
RBC: 4.06 MIL/uL (ref 3.87–5.11)
RDW: 14.3 % (ref 11.5–15.5)
WBC: 14.4 10*3/uL — AB (ref 4.0–10.5)

## 2017-11-23 LAB — TYPE AND SCREEN
ABO/RH(D): O POS
Antibody Screen: NEGATIVE

## 2017-11-23 MED ORDER — ONDANSETRON HCL 4 MG/2ML IJ SOLN
4.0000 mg | Freq: Four times a day (QID) | INTRAMUSCULAR | Status: DC | PRN
  Administered 2017-11-24 (×2): 4 mg via INTRAVENOUS
  Filled 2017-11-23 (×2): qty 2

## 2017-11-23 MED ORDER — FLEET ENEMA 7-19 GM/118ML RE ENEM: 1.0000 | ENEMA | RECTAL | Status: DC | PRN

## 2017-11-23 MED ORDER — HYDROXYZINE HCL 50 MG PO TABS
50.0000 mg | ORAL_TABLET | Freq: Four times a day (QID) | ORAL | Status: DC | PRN
  Filled 2017-11-23: qty 1

## 2017-11-23 MED ORDER — LACTATED RINGERS IV SOLN
INTRAVENOUS | Status: DC
  Administered 2017-11-23: 19:00:00 via INTRAVENOUS
  Administered 2017-11-24: 250 mL via INTRAVENOUS
  Administered 2017-11-24 (×2): via INTRAVENOUS

## 2017-11-23 MED ORDER — PENICILLIN G POT IN DEXTROSE 60000 UNIT/ML IV SOLN
3.0000 10*6.[IU] | INTRAVENOUS | Status: DC
  Administered 2017-11-24 (×4): 3 10*6.[IU] via INTRAVENOUS
  Filled 2017-11-23 (×7): qty 50

## 2017-11-23 MED ORDER — FENTANYL CITRATE (PF) 100 MCG/2ML IJ SOLN
50.0000 ug | INTRAMUSCULAR | Status: DC | PRN
  Administered 2017-11-24 (×6): 100 ug via INTRAVENOUS
  Filled 2017-11-23 (×7): qty 2

## 2017-11-23 MED ORDER — OXYCODONE-ACETAMINOPHEN 5-325 MG PO TABS: 1.0000 | ORAL_TABLET | ORAL | Status: DC | PRN

## 2017-11-23 MED ORDER — OXYTOCIN 40 UNITS IN LACTATED RINGERS INFUSION - SIMPLE MED: 2.5000 [IU]/h | INTRAVENOUS | Status: DC

## 2017-11-23 MED ORDER — OXYCODONE-ACETAMINOPHEN 5-325 MG PO TABS: 2.0000 | ORAL_TABLET | ORAL | Status: DC | PRN

## 2017-11-23 MED ORDER — MISOPROSTOL 25 MCG QUARTER TABLET: 25.0000 ug | ORAL_TABLET | ORAL | Status: DC | PRN

## 2017-11-23 MED ORDER — ACETAMINOPHEN 325 MG PO TABS: 650.0000 mg | ORAL_TABLET | ORAL | Status: DC | PRN

## 2017-11-23 MED ORDER — LIDOCAINE HCL (PF) 1 % IJ SOLN
30.0000 mL | INTRAMUSCULAR | Status: DC | PRN
  Filled 2017-11-23: qty 30

## 2017-11-23 MED ORDER — PROMETHAZINE HCL 25 MG/ML IJ SOLN
12.5000 mg | Freq: Four times a day (QID) | INTRAMUSCULAR | Status: DC | PRN
  Administered 2017-11-23: 12.5 mg via INTRAVENOUS
  Filled 2017-11-23: qty 1

## 2017-11-23 MED ORDER — LACTATED RINGERS IV SOLN: 500.0000 mL | INTRAVENOUS | Status: DC | PRN

## 2017-11-23 MED ORDER — TERBUTALINE SULFATE 1 MG/ML IJ SOLN
0.2500 mg | Freq: Once | INTRAMUSCULAR | Status: DC | PRN
  Filled 2017-11-23: qty 1

## 2017-11-23 MED ORDER — SOD CITRATE-CITRIC ACID 500-334 MG/5ML PO SOLN
30.0000 mL | ORAL | Status: DC | PRN
  Administered 2017-11-23: 30 mL via ORAL
  Filled 2017-11-23: qty 15

## 2017-11-23 MED ORDER — MISOPROSTOL 50MCG HALF TABLET
50.0000 ug | ORAL_TABLET | ORAL | Status: DC | PRN
  Filled 2017-11-23: qty 1

## 2017-11-23 MED ORDER — NALBUPHINE HCL 10 MG/ML IJ SOLN
5.0000 mg | INTRAMUSCULAR | Status: DC | PRN
  Administered 2017-11-23: 5 mg via INTRAVENOUS
  Filled 2017-11-23: qty 1

## 2017-11-23 MED ORDER — MISOPROSTOL 50MCG HALF TABLET
50.0000 ug | ORAL_TABLET | Freq: Once | ORAL | Status: AC
  Administered 2017-11-23: 50 ug via ORAL
  Filled 2017-11-23: qty 1

## 2017-11-23 MED ORDER — PENICILLIN G POTASSIUM 5000000 UNITS IJ SOLR
5.0000 10*6.[IU] | Freq: Once | INTRAMUSCULAR | Status: AC
  Administered 2017-11-23: 5 10*6.[IU] via INTRAVENOUS
  Filled 2017-11-23: qty 5

## 2017-11-23 MED ORDER — OXYTOCIN BOLUS FROM INFUSION
500.0000 mL | Freq: Once | INTRAVENOUS | Status: AC
  Administered 2017-11-24: 500 mL via INTRAVENOUS

## 2017-11-23 NOTE — H&P (Signed)
Obstetric History and Physical  Tiffany [name] is a 22 y.o. G2P0010 with IUP at 6152w4d presenting for IOL for FGR. Patient was seen in clinic today for follow-up US due to s<d. US showed 7.3% 2840g w/ AFI 13cm and dopp 71%. Patient states she has been having  none contractions, none vaginal bleeding, intact membranes, with active fetal movement.    Prenatal Course Source of Care: FT  with onset of care at 12 weeks Dating: 1st trimester US--->  Estimated Date of Delivery: 11/19/17 Pregnancy complications or risks: Patient Active Problem List   Diagnosis Date Noted  . Pyelonephritis affecting pregnancy in second trimester 07/29/2017  . 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 Supervision of normal first pregnancy 05/11/2017  . Marijuana use 05/11/2017  . Gastroesophageal reflux disease without esophagitis 03/07/2017  . Depression 03/07/2017  . Intrinsic eczema 03/07/2017   She plans to bottle feed She desires Depo-Provera for postpartum contraception.   Prenatal labs and studies: ABO, Rh: --/--/O POS (04/17 1900) Antibody: NEG (04/17 1900) Rubella: <0.90 (10/03 1212) RPR: Non Reactive (01/31 0901)  HBsAg: Negative (10/03 1212)  HIV: Non Reactive (01/31 0901)  GBS: Positive 2 hr Glucola  normal Genetic screening normal Anatomy US normal  Prenatal Transfer Tool  Maternal Diabetes: No Genetic Screening: Normal Maternal Ultrasounds/Referrals: Abnormal:  Findings:   Other:FGR Fetal Ultrasounds or other Referrals:  None Maternal Substance Abuse:  No Significant Maternal Medications:  None Significant Maternal Lab Results: Lab values include: Group B Strep positive  Past Medical History:  Diagnosis Date  . Anxiety   . Asthma   . Bronchitis in child   . Depression   . Eczema     Past Surgical History:  Procedure Laterality Date  . WISDOM TOOTH EXTRACTION      OB History  Gravida Para Term Preterm AB Living  2       1    SAB TAB Ectopic Multiple Live Births  1            # Outcome Date GA Lbr Len/2nd Weight Sex Delivery Anes PTL Lv  2 Current           1 SAB 2017            Social History   Socioeconomic History  . Marital status: Single    Spouse name: Not on file  . Number of children: Not on file  . Years of education: Not on file  . Highest education level: Not on file  Occupational History  . Not on file  Social Needs  . Financial resource strain: Not on file  . Food insecurity:    Worry: Not on file    Inability: Not on file  . Transportation needs:    Medical: Not on file    Non-medical: Not on file  Tobacco Use  . Smoking status: Former Smoker    Years: 1.00    Types: Cigarettes  . Smokeless tobacco: Never Used  Substance and Sexual Activity  . Alcohol use: No  . Drug use: No    Types: Marijuana    Comment: last used jan 2019  . Sexual activity: Yes    Birth control/protection: None  Lifestyle  . Physical activity:    Days per week: Not on file    Minutes per session: Not on file  . Stress: Not on file  Relationships  . Social connections:    Talks on phone: Not on file    Gets together: Not on file  Attends religious service: Not on file    Active member of club or organization: Not on file    Attends meetings of clubs or organizations: Not on file    Relationship status: Not on file  Other Topics Concern  . Not on file  Social History Narrative  . Not on file    Family History  Problem Relation Age of Onset  . COPD Mother   . Diabetes Mother        pre-diabetes  . Other Mother        boils  . COPD Paternal Grandfather   . Diabetes Paternal Grandmother   . COPD Maternal Grandmother   . Thyroid disease Maternal Grandmother   . Aneurysm Maternal Grandfather   . Thyroid disease Maternal Aunt     Medications Prior to Admission  Medication Sig Dispense Refill Last Dose  . albuterol (PROVENTIL HFA;VENTOLIN HFA) 108 (90 Base) MCG/ACT inhaler Inhale 2 puffs into the lungs every 6 (six) hours as needed for  wheezing or shortness of breath.    Past Month at Unknown time  . flintstones complete (FLINTSTONES) 60 MG chewable tablet Take 2 daily (Patient taking differently: Chew 1 tablet by mouth 2 (two) times daily. )   11/23/2017 at Unknown time  . promethazine (PHENERGAN) 25 MG tablet Take 0.5-1 tablets (12.5-25 mg total) by mouth every 6 (six) hours as needed for nausea or vomiting. (Patient taking differently: Take 25 mg by mouth every 6 (six) hours as needed for nausea or vomiting. ) 30 tablet 0 Past Week at Unknown time  . triamcinolone cream (KENALOG) 0.1 % Apply 1 application topically 2 (two) times daily. (Patient taking differently: Apply 1 application topically 2 (two) times daily as needed (eczema). ) 454 g 2 Past Week at Unknown time  . cephALEXin (KEFLEX) 500 MG capsule Take 1 capsule (500 mg total) by mouth 3 (three) times daily. (Patient not taking: Reported on 11/22/2017) 30 capsule 4 Not Taking  . ondansetron (ZOFRAN ODT) 8 MG disintegrating tablet Take 1 tablet (8 mg total) by mouth every 8 (eight) hours as needed for nausea or vomiting. (Patient not taking: Reported on 11/23/2017) 20 tablet 0 Not Taking at Unknown time  . promethazine (PHENERGAN) 25 MG tablet TAKE 1 TABLET(25 MG) BY MOUTH EVERY 6 HOURS AS NEEDED FOR NAUSEA (Patient not taking: Reported on 11/22/2017) 30 tablet 0 Not Taking    No Known Allergies  Review of Systems: Negative except for what is mentioned in HPI.  Physical Exam: BP 97/83   Pulse 76   Temp (!) 97.1 F (36.2 C) (Oral)   Resp 16   Ht 5\' 2"  (1.575 m)   Wt 75.3 kg (166 lb)   LMP 02/27/2017 (Approximate)   BMI 30.36 kg/m  CONSTITUTIONAL: Well-developed, well-nourished female in no acute distress.  HENT:  Normocephalic, atraumatic, External right and left ear normal. Oropharynx is clear and moist EYES: Conjunctivae and EOM are normal. Pupils are equal, round, and reactive to light. No scleral icterus.  NECK: Normal range of motion, supple, no masses SKIN:  Skin is warm and dry. No rash noted. Not diaphoretic. No erythema. No pallor. NEUROLOGIC: Alert and oriented to person, place, and time. Normal reflexes, muscle tone coordination. No cranial nerve deficit noted. PSYCHIATRIC: Normal mood and affect. Normal behavior. Normal judgment and thought content. CARDIOVASCULAR: Normal heart rate noted, regular rhythm RESPIRATORY: Effort and breath sounds normal, no problems with respiration noted ABDOMEN: Soft, nontender, nondistended, gravid. MUSCULOSKELETAL: Normal range of motion. No  edema and no tenderness. 2+ distal pulses.  Cervical Exam: Dilation: 1 Effacement (%): 50 Cervical Position: Middle Station: -2 Presentation: Vertex Exam by:: Marvell Fuller RN  Presentation: cephalic FHT:  Baseline rate 145 bpm   Variability moderate  Accelerations present   Decelerations variable Contractions: UI   Pertinent Labs/Studies:   Results for orders placed or performed during the hospital encounter of 11/23/17 (from the past 24 hour(s))  CBC     Status: Abnormal   Collection Time: 11/23/17  7:00 PM  Result Value Ref Range   WBC 14.4 (H) 4.0 - 10.5 K/uL   RBC 4.06 3.87 - 5.11 MIL/uL   Hemoglobin 11.5 (L) 12.0 - 15.0 g/dL   HCT 16.1 (L) 09.6 - 04.5 %   MCV 84.7 78.0 - 100.0 fL   MCH 28.3 26.0 - 34.0 pg   MCHC 33.4 30.0 - 36.0 g/dL   RDW 40.9 81.1 - 91.4 %   Platelets 333 150 - 400 K/uL  Type and screen     Status: None   Collection Time: 11/23/17  7:00 PM  Result Value Ref Range   ABO/RH(D) O POS    Antibody Screen NEG    Sample Expiration      11/26/2017 Performed at [facility], [address] [place], [place] [address]     Assessment : Tiffany [name] is a 22 y.o. G2P0010 at [redacted]w[redacted]d being admitted for induction of labor due to FGR.  Plan: Labor: Induction with Cytotec for cervical ripening Analgesia as needed and per maternal request FWB: Reassuring fetal heart tracing GBS positive; start PCN Delivery plan: Hopeful for vaginal  delivery   Caryl Ada, DO OB Fellow Faculty Practice, [facility] [place] 11/23/2017, 8:55 PM

## 2017-11-23 NOTE — Progress Notes (Signed)
US 40+4 wks,cephalic,anterior pl gr 3,FHR 161154 bpm,afi 13 cm,BPP 8/8,RI .59,.56=71%,EFW 2840 g 7.3%,discussed results w/Kim

## 2017-11-23 NOTE — Progress Notes (Signed)
Pt here today for efw d/t s<d at visit yesterday. EFW 7.3%/2840g w/ AFI 13cm and dopp 71%. Notified Dr. Ashok PallWouk, and L&D charge of direct admit for IOL for FGR.  Cheral MarkerKimberly R. Katlen Seyer, CNM, [facility]NP[facility] 11/23/2017 3:47 PM

## 2017-11-23 NOTE — H&P (Signed)
Tiffany [name] is a 22 y.o. female  G2P0010 @[redacted]w[redacted]d  presenting for IUGR <10%tile.  She presented to prenatal visit today and was size < dates.  [place] results were as follows: cephalic,anterior ,FHR 154 bpm,afi 13 cm,BPP 8/8,RI .59,.56=71%,EFW 2840 g 7.3%.  Pt denies any cramping/contractions, LOF, vaginal bleeding, h/a, n/v, or fever.      [facility]  Initiated Care at  12wk  FOB [place]  Dating By 1st trimester U/S8wk  Pap <21  GC/CT Initial:     -/-   36+wks:  Genetic Screen NT/IT:neg  CF screen neg  Anatomic [place] Normal female  Flu vaccine declined  Tdap Recommended ~ 28wks  Glucose Screen  2 hr  76/99/101  GBS   Feed Preference bottle  Contraception Undecided, discussed  Circumcision n/a  Childbirth Classes Didn't make it  Pediatrician List given     OB History    Gravida  2   Para      Term      Preterm      AB  1   Living        SAB  1   TAB      Ectopic      Multiple      Live Births             Past Medical History:  Diagnosis Date  . Anxiety   . Asthma   . Bronchitis in child   . Depression   . Eczema    Past Surgical History:  Procedure Laterality Date  . WISDOM TOOTH EXTRACTION     Family History: family history includes Aneurysm in her maternal grandfather; COPD in her maternal grandmother, mother, and paternal grandfather; Diabetes in her mother and paternal grandmother; Other in her mother; Thyroid disease in her maternal aunt and maternal grandmother. Social History:  reports that she has quit smoking. Her smoking use included cigarettes. She quit after 1.00 year of use. She has never used smokeless tobacco. She reports that she does not drink alcohol or use drugs.     Maternal Diabetes: No Genetic Screening: Normal Maternal Ultrasounds/Referrals: Normal Fetal Ultrasounds or other Referrals:  None Maternal Substance Abuse:  Yes:  Type: Marijuana Significant Maternal Medications:  None Significant Maternal Lab Results:   Lab values include: Group B Strep negative Other Comments:  None  Review of Systems  Constitutional: Negative for chills, fever and malaise/fatigue.  Eyes: Negative for blurred vision.  Respiratory: Negative for cough and shortness of breath.   Cardiovascular: Negative for chest pain.  Gastrointestinal: Negative for abdominal pain, heartburn and vomiting.  Genitourinary: Negative for dysuria, frequency and urgency.  Musculoskeletal: Negative.   Neurological: Negative for dizziness and headaches.  Psychiatric/Behavioral: Negative for depression.   Maternal Medical History:  Reason for admission: IUGR  Contractions: Frequency: rare.   Perceived severity is mild.    Fetal activity: Perceived fetal activity is normal.    Prenatal complications: no prenatal complications Prenatal Complications - Diabetes: none.    Dilation: 1 Effacement (%): 50 Station: -2 Exam by:: Marvell Fuller RN Blood pressure 97/83, pulse 76, temperature (!) 97.1 F (36.2 C), temperature source Oral, resp. rate 16, height 5\' 2"  (1.575 m), weight 166 lb (75.3 kg), last menstrual period 02/27/2017. Maternal Exam:  Uterine Assessment: Contraction strength is mild.  Contraction frequency is rare.   Abdomen: Fetal presentation: vertex  Cervix: Cervix evaluated by digital exam.     Fetal Exam Fetal Monitor Review: Mode: ultrasound.   Baseline  rate: 135.  Variability: moderate (6-25 bpm).   Pattern: accelerations present and no decelerations.    Fetal State Assessment: Category I - tracings are normal.     Physical Exam  Nursing note and vitals reviewed. Constitutional: She is oriented to person, place, and time. She appears well-developed and well-nourished.  Neck: Normal range of motion.  Cardiovascular: Normal rate, regular rhythm and normal heart sounds.  Respiratory: Effort normal and breath sounds normal.  GI: Soft.  Musculoskeletal: Normal range of motion.  Neurological: She is alert and  oriented to person, place, and time.  Skin: Skin is warm and dry.  Psychiatric: She has a normal mood and affect. Her behavior is normal. Judgment and thought content normal.    Prenatal labs: ABO, Rh: --/--/O POS (04/17 1900) Antibody: NEG (04/17 1900) Rubella: <0.90 (10/03 1212) RPR: Non Reactive (01/31 0901)  HBsAg: Negative (10/03 1212)  HIV: Non Reactive (01/31 0901)  GBS:   Positive  Assessment/Plan: G2P0010 @[redacted]w[redacted]d  IOL for IUGR GBS positive  PCN for prophylaxis Cytotec PO for IOL Female providers only, no students/residents at pt request Anticipate NSVD   Sharen CounterLisa Leftwich-Kirby 11/23/2017, 10:04 PM

## 2017-11-23 NOTE — Telephone Encounter (Signed)
Preadmission screen  

## 2017-11-24 ENCOUNTER — Encounter (HOSPITAL_COMMUNITY): Payer: Self-pay

## 2017-11-24 DIAGNOSIS — O99824 Streptococcus B carrier state complicating childbirth: Secondary | ICD-10-CM

## 2017-11-24 DIAGNOSIS — O365930 Maternal care for other known or suspected poor fetal growth, third trimester, not applicable or unspecified: Secondary | ICD-10-CM

## 2017-11-24 DIAGNOSIS — Z3490 Encounter for supervision of normal pregnancy, unspecified, unspecified trimester: Secondary | ICD-10-CM | POA: Diagnosis present

## 2017-11-24 DIAGNOSIS — Z3A40 40 weeks gestation of pregnancy: Secondary | ICD-10-CM

## 2017-11-24 LAB — STREP GP B NAA: Strep Gp B NAA: POSITIVE — AB

## 2017-11-24 LAB — RPR: RPR Ser Ql: NONREACTIVE

## 2017-11-24 LAB — GC/CHLAMYDIA PROBE AMP
CHLAMYDIA, DNA PROBE: NEGATIVE
NEISSERIA GONORRHOEAE BY PCR: NEGATIVE

## 2017-11-24 MED ORDER — OXYTOCIN 40 UNITS IN LACTATED RINGERS INFUSION - SIMPLE MED: 1.0000 m[IU]/min | INTRAVENOUS | Status: DC

## 2017-11-24 MED ORDER — ZOLPIDEM TARTRATE 5 MG PO TABS: 5.0000 mg | ORAL_TABLET | Freq: Every evening | ORAL | Status: DC | PRN

## 2017-11-24 MED ORDER — ACETAMINOPHEN 325 MG PO TABS: 650.0000 mg | ORAL_TABLET | ORAL | Status: DC | PRN

## 2017-11-24 MED ORDER — DIPHENHYDRAMINE HCL 25 MG PO CAPS: 25.0000 mg | ORAL_CAPSULE | Freq: Four times a day (QID) | ORAL | Status: DC | PRN

## 2017-11-24 MED ORDER — ZOLPIDEM TARTRATE 5 MG PO TABS
5.0000 mg | ORAL_TABLET | Freq: Every evening | ORAL | Status: DC | PRN
  Administered 2017-11-24: 5 mg via ORAL
  Filled 2017-11-24: qty 1

## 2017-11-24 MED ORDER — OXYTOCIN 40 UNITS IN LACTATED RINGERS INFUSION - SIMPLE MED
1.0000 m[IU]/min | INTRAVENOUS | Status: DC
  Administered 2017-11-24: 1 m[IU]/min via INTRAVENOUS
  Filled 2017-11-24: qty 1000

## 2017-11-24 MED ORDER — BENZOCAINE-MENTHOL 20-0.5 % EX AERO: 1.0000 "application " | INHALATION_SPRAY | CUTANEOUS | Status: DC | PRN

## 2017-11-24 MED ORDER — ONDANSETRON HCL 4 MG PO TABS
4.0000 mg | ORAL_TABLET | ORAL | Status: DC | PRN
  Administered 2017-11-25 (×3): 4 mg via ORAL
  Filled 2017-11-24 (×3): qty 1

## 2017-11-24 MED ORDER — MISOPROSTOL 25 MCG QUARTER TABLET
25.0000 ug | ORAL_TABLET | ORAL | Status: DC | PRN
  Administered 2017-11-24: 25 ug via BUCCAL
  Filled 2017-11-24 (×2): qty 1

## 2017-11-24 MED ORDER — SIMETHICONE 80 MG PO CHEW: 80.0000 mg | CHEWABLE_TABLET | ORAL | Status: DC | PRN

## 2017-11-24 MED ORDER — ONDANSETRON HCL 4 MG/2ML IJ SOLN
4.0000 mg | INTRAMUSCULAR | Status: DC | PRN
  Administered 2017-11-25: 4 mg via INTRAVENOUS
  Filled 2017-11-24: qty 2

## 2017-11-24 MED ORDER — TETANUS-DIPHTH-ACELL PERTUSSIS 5-2.5-18.5 LF-MCG/0.5 IM SUSP: 0.5000 mL | Freq: Once | INTRAMUSCULAR | Status: DC

## 2017-11-24 MED ORDER — SENNOSIDES-DOCUSATE SODIUM 8.6-50 MG PO TABS
2.0000 | ORAL_TABLET | ORAL | Status: DC
  Administered 2017-11-25: 2 via ORAL
  Filled 2017-11-24 (×2): qty 2

## 2017-11-24 MED ORDER — DIBUCAINE 1 % RE OINT: 1.0000 "application " | TOPICAL_OINTMENT | RECTAL | Status: DC | PRN

## 2017-11-24 MED ORDER — COCONUT OIL OIL: 1.0000 "application " | TOPICAL_OIL | Status: DC | PRN

## 2017-11-24 MED ORDER — PRENATAL MULTIVITAMIN CH
1.0000 | ORAL_TABLET | Freq: Every day | ORAL | Status: DC
  Filled 2017-11-24 (×2): qty 1

## 2017-11-24 MED ORDER — WITCH HAZEL-GLYCERIN EX PADS: 1.0000 "application " | MEDICATED_PAD | CUTANEOUS | Status: DC | PRN

## 2017-11-24 MED ORDER — IBUPROFEN 600 MG PO TABS
600.0000 mg | ORAL_TABLET | Freq: Four times a day (QID) | ORAL | Status: DC
  Administered 2017-11-26: 600 mg via ORAL
  Filled 2017-11-24 (×6): qty 1

## 2017-11-24 NOTE — Progress Notes (Signed)
LABOR PROGRESS NOTE  Tiffany [name] is a 22 y.o. G2P0010 at 4463w5d  admitted for IOL for FGR   Subjective: Patient doing well, not feeling any contractions or cramping, up talking to family and texting on phone.   Objective: BP 112/81   Pulse (!) 110   Temp 97.6 F (36.4 C)   Resp 16   Ht 5\' 2"  (1.575 m)   Wt 166 lb (75.3 kg)   LMP 02/27/2017 (Approximate)   BMI 30.36 kg/m  or  Vitals:   11/24/17 1103 11/24/17 1130 11/24/17 1201 11/24/17 1231  BP: 109/81 (!) 126/96 (!) 122/97 112/81  Pulse: (!) 101 (!) 102 (!) 228 (!) 110  Resp: 16 16 16 16   Temp:    97.6 F (36.4 C)  TempSrc:      Weight:      Height:        Discussed with patient option of AROM- patient declines AROM at this time, wants to wait to see if any cervical change.  Dilation: 4.5 Effacement (%): 70 Cervical Position: Middle Station: -2 Presentation: Vertex Exam by:: V.Fransisca Shawn,CNM FHT: baseline rate 130, moderate varibility, +acel, no decel Toco: 4-6 minutes/ mild by palpation   Labs: Lab Results  Component Value Date   WBC 14.4 (H) 11/23/2017   HGB 11.5 (L) 11/23/2017   HCT 34.4 (L) 11/23/2017   MCV 84.7 11/23/2017   PLT 333 11/23/2017    Patient Active Problem List   Diagnosis Date Noted  . Encounter for induction of labor 11/24/2017  . Pyelonephritis affecting pregnancy in second trimester 07/29/2017  . 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 Supervision of normal first pregnancy 05/11/2017  . Marijuana use 05/11/2017  . Gastroesophageal reflux disease without esophagitis 03/07/2017  . Depression 03/07/2017  . Intrinsic eczema 03/07/2017    Assessment / Plan: 22 y.o. G2P0010 at 5763w5d here for IOL for FGR   Labor: No cervical change over 4 hour time period, discussed AROM- patient declines AROM at this time and wants to reassess cervical change around 1500.  Fetal Wellbeing:  Cat I Pain Control:  Medications ordered PRN- no medications requested at this time  Anticipated MOD:  SVD    Sharyon CableRogers, Issis Lindseth C, CNM 11/24/2017, 12:54 PM

## 2017-11-24 NOTE — Anesthesia Pain Management Evaluation Note (Signed)
  CRNA Pain Management Visit Note  Patient: Tiffany MyronSavannah R [name], 22 y.o., female  "Hello I am a member of the anesthesia team at [facility]. We have an anesthesia team available at all times to provide care throughout the hospital, including epidural management and anesthesia for C-section. I don't know your plan for the delivery whether it a natural birth, water birth, IV sedation, nitrous supplementation, doula or epidural, but we want to meet your pain goals."   1.Was your pain managed to your expectations on prior hospitalizations?   No   2.What is your expectation for pain management during this hospitalization?     Labor support without medications and Epidural  3.How can we help you reach that goal? Natural   Record the patient's initial score and the patient's pain goal.   Pain: 5  Pain Goal: 10 [facility] wants you to be able to say your pain was always managed very well.  Tiffany [name] 11/24/2017

## 2017-11-24 NOTE — Progress Notes (Signed)
LABOR PROGRESS NOTE  Tiffany [name] is a 22 y.o. G2P0010 at 3477w5d  admitted for IOL for FGR.  Subjective: Patient is comfortable with contractions- breathing through some contractions. Recently received IV pain medication.   Objective: BP 108/67   Pulse 85   Temp 97.6 F (36.4 C) (Axillary)   Resp 16   Ht 5\' 2"  (1.575 m)   Wt 166 lb (75.3 kg)   LMP 02/27/2017 (Approximate)   BMI 30.36 kg/m  or  Vitals:   11/24/17 0633 11/24/17 0703 11/24/17 0812 11/24/17 0902  BP: 101/85 (!) 120/57 120/77 108/67  Pulse: 83 93 (!) 125 85  Resp:  16    Temp: 97.6 F (36.4 C)     TempSrc: Axillary     Weight:      Height:        Foley bulb out at 0900 Dilation: 4.5 Effacement (%): 90 Cervical Position: Middle Station: -2 Presentation: Vertex Exam by:: J.Follmer,RNC FHT: baseline rate 140, moderate varibility, +acel, no decel Toco: occasional mild-moderate contractions  Labs: Lab Results  Component Value Date   WBC 14.4 (H) 11/23/2017   HGB 11.5 (L) 11/23/2017   HCT 34.4 (L) 11/23/2017   MCV 84.7 11/23/2017   PLT 333 11/23/2017    Patient Active Problem List   Diagnosis Date Noted  . Encounter for induction of labor 11/24/2017  . Pyelonephritis affecting pregnancy in second trimester 07/29/2017  . 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 Supervision of normal first pregnancy 05/11/2017  . Marijuana use 05/11/2017  . Gastroesophageal reflux disease without esophagitis 03/07/2017  . Depression 03/07/2017  . Intrinsic eczema 03/07/2017    Assessment / Plan: 22 y.o. G2P0010 at 2777w5d here for IOL for FGR   Labor: IOL progressing well, FB out at 0900- Pitocin ordered to be titrated to active labor  Fetal Wellbeing:  Cat I  Pain Control:  IV pain medication, possible epidural- medications ordered PRN  Anticipated MOD:  SVD  Sharyon CableRogers, Jarquavious Fentress C, CNM 11/24/2017, 9:20 AM

## 2017-11-24 NOTE — Progress Notes (Signed)
Labor Progress Note  Tiffany [name] is a 22 y.o. G2P0010 at 8165w5d admitted for induction of labor due to Phill Myronoor fetal growth.  S: Sleeping comfortably.   O:  BP 121/68   Pulse 100   Temp (!) 97.4 F (36.3 C) (Axillary)   Resp 16   Ht 5\' 2"  (1.575 m)   Wt 75.3 kg (166 lb)   LMP 02/27/2017 (Approximate)   BMI 30.36 kg/m   No intake/output data recorded.  FHT:  FHR: 140 bpm, variability: moderate,  accelerations:  Abscent,  decelerations:  Absent UC:  regualr 2-4 SVE:   Dilation: 2 Effacement (%): 80 Station: -2 Exam by:: dr Doroteo Glassmanphelps   Labs: Lab Results  Component Value Date   WBC 14.4 (H) 11/23/2017   HGB 11.5 (L) 11/23/2017   HCT 34.4 (L) 11/23/2017   MCV 84.7 11/23/2017   PLT 333 11/23/2017    Assessment / Plan: 22 y.o. G2P0010 10165w5d. Not in labor.  Induction of labor due to poor fetal growth,  progressing normally  Labor: Progressing normally. FB in place. Continue cytotec for ripening.  Fetal Wellbeing:  Category I Pain Control:  Labor support without medications Anticipated MOD:  NSVD  Expectant management   Caryl AdaJazma Vieva Brummitt, DO OB Fellow Center for [facility], [facility]

## 2017-11-24 NOTE — Progress Notes (Signed)
Labor Progress Note  Phill MyronSavannah R Curet is a 22 y.o. G2P0010 at 5367w5d admitted for induction of labor due to Poor fetal growth.  S: No concerns voiced. Having some cramping.   O:  BP 117/66   Pulse 66   Temp (!) 97.4 F (36.3 C) (Oral)   Resp 16   Ht 5\' 2"  (1.575 m)   Wt 75.3 kg (166 lb)   LMP 02/27/2017 (Approximate)   BMI 30.36 kg/m   No intake/output data recorded.  FHT:  FHR: 175 bpm, variability: moderate,  accelerations:  Present,  decelerations:  Present variable UC:   none SVE:   Dilation: 2 Effacement (%): 70 Station: -2 Exam by:: amwalker,rn   Labs: Lab Results  Component Value Date   WBC 14.4 (H) 11/23/2017   HGB 11.5 (L) 11/23/2017   HCT 34.4 (L) 11/23/2017   MCV 84.7 11/23/2017   PLT 333 11/23/2017    Assessment / Plan: 22 y.o. G2P0010 4767w5d. Not in labor.  Induction of labor due to poor fetal growth,  progressing normally  Labor: Progressing normally on cytotec. Continue cytotec for ripening. Discussed placement of FB but patient wants to wait at this time.  Fetal Wellbeing:  Category II Pain Control:  Labor support without medications Anticipated MOD:  NSVD  Expectant management   Caryl AdaJazma Millena Callins, DO OB Fellow Center for [facility], [facility]

## 2017-11-24 NOTE — Progress Notes (Signed)
FB placed. Fetal strip reassuring.

## 2017-11-24 NOTE — Progress Notes (Addendum)
LABOR PROGRESS NOTE  Tiffany [name] is a 22 y.o. G2P0010 at 5758w5d  admitted for IOL for FGR  Subjective: Patient doing well, starting to feel some contractions requesting IV pain medication   Objective: BP 113/79   Pulse 79   Temp 98.6 F (37 [name]) (Oral)   Resp 16   Ht 5\' 2"  (1.575 m)   Wt 166 lb (75.3 kg)   LMP 02/27/2017 (Approximate)   SpO2 100%   BMI 30.36 kg/m  or  Vitals:   11/24/17 1501 11/24/17 1541 11/24/17 1607 11/24/17 1631  BP: (!) 105/56  122/75 113/79  Pulse: 70  83 79  Resp: 16  16   Temp:  98.6 F (37 [name])    TempSrc:  Oral    SpO2:    100%  Weight:      Height:        AROM @1520 - clear fluid  Dilation: 5 Effacement (%): 80 Cervical Position: Middle Station: -1 Presentation: Vertex Exam by:: V.Raedyn Wenke,CNM FHT: baseline rate 150, moderate varibility, +acel, early decel Toco: 1-3/ moderate by palpation   Labs: Lab Results  Component Value Date   WBC 14.4 (H) 11/23/2017   HGB 11.5 (L) 11/23/2017   HCT 34.4 (L) 11/23/2017   MCV 84.7 11/23/2017   PLT 333 11/23/2017    Patient Active Problem List   Diagnosis Date Noted  . Encounter for induction of labor 11/24/2017  . Pyelonephritis affecting pregnancy in second trimester 07/29/2017  . 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 Supervision of normal first pregnancy 05/11/2017  . Marijuana use 05/11/2017  . Gastroesophageal reflux disease without esophagitis 03/07/2017  . Depression 03/07/2017  . Intrinsic eczema 03/07/2017    Assessment / Plan: 22 y.o. G2P0010 at 8058w5d here for IOL for FGR   Labor: Slow progression, AROM @1520 , IUPC placed  Fetal Wellbeing:  Cat I Pain Control:  IV pain medication  Anticipated MOD:  SVD  Tiffany [name], Tiffany [name], CNM 11/24/2017, 4:44 PM

## 2017-11-25 ENCOUNTER — Other Ambulatory Visit: Payer: Self-pay

## 2017-11-25 NOTE — Progress Notes (Signed)
Post Partum Day 1 Subjective: no complaints, up ad lib, voiding and tolerating PO  Objective: Blood pressure 107/73, pulse 97, temperature 98.2 F (36.8 C), temperature source Oral, resp. rate 16, height 5\' 2"  (1.575 m), weight 75.3 kg (166 lb), last menstrual period 02/27/2017, SpO2 98 %, unknown if currently breastfeeding.  Physical Exam:  General: alert, cooperative and no distress Lochia: appropriate Uterine Fundus: firm DVT Evaluation: No evidence of DVT seen on physical exam.  Recent Labs    11/23/17 1900  HGB 11.5*  HCT 34.4*    Assessment/Plan: Plan for discharge tomorrow  Social work consult Infant in NICU    LOS: 2 days   Caryl AdaJazma Rami Waddle, DO 11/25/2017, 10:24 AM

## 2017-11-25 NOTE — Progress Notes (Signed)
CSW attempted to meet with MOB to offer support and complete assessment due to hx of depression/anxiety, marijuana use and baby's admission to NICU, but she was asleep at this time.  CSW will attempt again at a later time. 

## 2017-11-26 ENCOUNTER — Inpatient Hospital Stay (HOSPITAL_COMMUNITY): Admission: RE | Admit: 2017-11-26 | Payer: Medicaid Other | Source: Ambulatory Visit

## 2017-11-26 DIAGNOSIS — O365990 Maternal care for other known or suspected poor fetal growth, unspecified trimester, not applicable or unspecified: Secondary | ICD-10-CM | POA: Diagnosis present

## 2017-11-26 MED ORDER — MEDROXYPROGESTERONE ACETATE 150 MG/ML IM SUSP
150.0000 mg | Freq: Once | INTRAMUSCULAR | Status: AC
  Administered 2017-11-26: 150 mg via INTRAMUSCULAR
  Filled 2017-11-26: qty 1

## 2017-11-26 MED ORDER — MEASLES, MUMPS & RUBELLA VAC ~~LOC~~ INJ: 0.5000 mL | INJECTION | Freq: Once | SUBCUTANEOUS | Status: DC

## 2017-11-26 MED ORDER — IBUPROFEN 600 MG PO TABS: 600.0000 mg | ORAL_TABLET | Freq: Four times a day (QID) | ORAL | 0 refills | Status: DC | PRN

## 2017-11-26 NOTE — Discharge Instructions (Signed)
Vaginal Delivery, Care After °Refer to this sheet in the next few weeks. These instructions provide you with information about caring for yourself after vaginal delivery. Your health care provider may also give you more specific instructions. Your treatment has been planned according to current medical practices, but problems sometimes occur. Call your health care provider if you have any problems or questions. °What can I expect after the procedure? °After vaginal delivery, it is common to have: °· Some bleeding from your vagina. °· Soreness in your abdomen, your vagina, and the area of skin between your vaginal opening and your anus (perineum). °· Pelvic cramps. °· Fatigue. ° °Follow these instructions at home: °Medicines °· Take over-the-counter and prescription medicines only as told by your health care provider. °· If you were prescribed an antibiotic medicine, take it as told by your health care provider. Do not stop taking the antibiotic until it is finished. °Driving ° °· Do not drive or operate heavy machinery while taking prescription pain medicine. °· Do not drive for 24 hours if you received a sedative. °Lifestyle °· Do not drink alcohol. This is especially important if you are breastfeeding or taking medicine to relieve pain. °· Do not use tobacco products, including cigarettes, chewing tobacco, or e-cigarettes. If you need help quitting, ask your health care provider. °Eating and drinking °· Drink at least 8 eight-ounce glasses of water every day unless you are told not to by your health care provider. If you choose to breastfeed your baby, you may need to drink more water than this. °· Eat high-fiber foods every day. These foods may help prevent or relieve constipation. High-fiber foods include: °? Whole grain cereals and breads. °? Brown rice. °? Beans. °? Fresh fruits and vegetables. °Activity °· Return to your normal activities as told by your health care provider. Ask your health care provider  what activities are safe for you. °· Rest as much as possible. Try to rest or take a nap when your baby is sleeping. °· Do not lift anything that is heavier than your baby or 10 lb (4.5 kg) until your health care provider says that it is safe. °· Talk with your health care provider about when you can engage in sexual activity. This may depend on your: °? Risk of infection. °? Rate of healing. °? Comfort and desire to engage in sexual activity. °Vaginal Care °· If you have an episiotomy or a vaginal tear, check the area every day for signs of infection. Check for: °? More redness, swelling, or pain. °? More fluid or blood. °? Warmth. °? Pus or a bad smell. °· Do not use tampons or douches until your health care provider says this is safe. °· Watch for any blood clots that may pass from your vagina. These may look like clumps of dark red, brown, or black discharge. °General instructions °· Keep your perineum clean and dry as told by your health care provider. °· Wear loose, comfortable clothing. °· Wipe from front to back when you use the toilet. °· Ask your health care provider if you can shower or take a bath. If you had an episiotomy or a perineal tear during labor and delivery, your health care provider may tell you not to take baths for a certain length of time. °· Wear a bra that supports your breasts and fits you well. °· If possible, have someone help you with household activities and help care for your baby for at least a few days after   you leave the hospital. °· Keep all follow-up visits for you and your baby as told by your health care provider. This is important. °Contact a health care provider if: °· You have: °? Vaginal discharge that has a bad smell. °? Difficulty urinating. °? Pain when urinating. °? A sudden increase or decrease in the frequency of your bowel movements. °? More redness, swelling, or pain around your episiotomy or vaginal tear. °? More fluid or blood coming from your episiotomy or  vaginal tear. °? Pus or a bad smell coming from your episiotomy or vaginal tear. °? A fever. °? A rash. °? Little or no interest in activities you used to enjoy. °? Questions about caring for yourself or your baby. °· Your episiotomy or vaginal tear feels warm to the touch. °· Your episiotomy or vaginal tear is separating or does not appear to be healing. °· Your breasts are painful, hard, or turn red. °· You feel unusually sad or worried. °· You feel nauseous or you vomit. °· You pass large blood clots from your vagina. If you pass a blood clot from your vagina, save it to show to your health care provider. Do not flush blood clots down the toilet without having your health care provider look at them. °· You urinate more than usual. °· You are dizzy or light-headed. °· You have not breastfed at all and you have not had a menstrual period for 12 weeks after delivery. °· You have stopped breastfeeding and you have not had a menstrual period for 12 weeks after you stopped breastfeeding. °Get help right away if: °· You have: °? Pain that does not go away or does not get better with medicine. °? Chest pain. °? Difficulty breathing. °? Blurred vision or spots in your vision. °? Thoughts about hurting yourself or your baby. °· You develop pain in your abdomen or in one of your legs. °· You develop a severe headache. °· You faint. °· You bleed from your vagina so much that you fill two sanitary pads in one hour. °This information is not intended to replace advice given to you by your health care provider. Make sure you discuss any questions you have with your health care provider. °Document Released: 07/23/2000 Document Revised: 01/07/2016 Document Reviewed: 08/10/2015 °Elsevier Interactive Patient Education © 2018 Elsevier Inc. ° °

## 2017-11-26 NOTE — Discharge Summary (Signed)
OB Discharge Summary     Patient Name: Tiffany [name] DOB: March 05, 1996 MRN: [id number]  Date of admission: 11/23/2017 Delivering MD: Lajean Manes   Date of discharge: 11/26/2017  Admitting diagnosis: INDUCTION Intrauterine pregnancy: [redacted]w[redacted]d    Secondary diagnosis:  Principal Problem:   Supervision of normal first pregnancy Active Problems:   Marijuana use   Pyelonephritis affecting pregnancy in second trimester   Encounter for induction of labor   SVD (spontaneous vaginal delivery)   Pregnancy affected by fetal growth restriction  Additional problems: GBS pos     Discharge diagnosis: Term Pregnancy Delivered                                                                                                Post partum procedures:offered MMR and Depo prior to d/c  Augmentation: AROM, Pitocin, Cytotec and Foley Balloon  Complications: None  Hospital course:  Induction of Labor With Vaginal Delivery   22y.o. yo G2P1011 at 462w5das admitted to the hospital 11/23/2017 for induction of labor.  Indication for induction: FGR.  Patient had an uncomplicated labor course as follows: Membrane Rupture Time/Date: 3:20 PM ,11/24/2017   Intrapartum Procedures: Episiotomy: None [1]                                         Lacerations:  Labial [10]  Patient had delivery of a Viable infant.  Information for the patient's newborn:  MaIsadore, BokhariiRichland0[[id number]]Delivery Method: Vaginal, Spontaneous(Filed from Delivery Summary)   11/24/2017  Details of delivery can be found in separate delivery note.  Patient had a routine postpartum course. Patient is discharged home 11/26/17.  Physical exam  Vitals:   11/24/17 2147 11/24/17 2250 11/25/17 0336 11/26/17 0604  BP: 114/81 123/69 107/73 (!) 92/57  Pulse: 68 86 97 72  Resp: '16 16 16 16  ' Temp: 97.8 F (36.6 C) 98.9 F (37.2 C) 98.2 F (36.8 C) 98.2 F (36.8 C)  TempSrc: Oral Oral Oral Oral  SpO2:      Weight:      Height:        General: alert and cooperative Lochia: appropriate Uterine Fundus: firm Incision: N/A DVT Evaluation: No evidence of DVT seen on physical exam. Labs: Lab Results  Component Value Date   WBC 14.4 (H) 11/23/2017   HGB 11.5 (L) 11/23/2017   HCT 34.4 (L) 11/23/2017   MCV 84.7 11/23/2017   PLT 333 11/23/2017   CMP Latest Ref Rng & Units 07/29/2017  Glucose 65 - 99 mg/dL 74  BUN 6 - 20 mg/dL 8  Creatinine 0.44 - 1.00 mg/dL 0.68  Sodium 135 - 145 mmol/L 129(L)  Potassium 3.5 - 5.1 mmol/L 3.4(L)  Chloride 101 - 111 mmol/L 89(L)  CO2 22 - 32 mmol/L 23  Calcium 8.9 - 10.3 mg/dL 9.7  Total Protein 6.5 - 8.1 g/dL 7.8  Total Bilirubin 0.3 - 1.2 mg/dL 1.1  Alkaline Phos 38 - 126 U/L 118  AST 15 -  41 U/L 16  ALT 14 - 54 U/L 12(L)    Discharge instruction: per After Visit Summary and "Baby and Me Booklet".  After visit meds:  Allergies as of 11/26/2017   No Known Allergies     Medication List    STOP taking these medications   cephALEXin 500 MG capsule Commonly known as:  KEFLEX   ondansetron 8 MG disintegrating tablet Commonly known as:  ZOFRAN ODT   promethazine 25 MG tablet Commonly known as:  PHENERGAN   triamcinolone cream 0.1 % Commonly known as:  KENALOG     TAKE these medications   albuterol 108 (90 Base) MCG/ACT inhaler Commonly known as:  PROVENTIL HFA;VENTOLIN HFA Inhale 2 puffs into the lungs every 6 (six) hours as needed for wheezing or shortness of breath.   flintstones complete 60 MG chewable tablet Take 2 daily What changed:    how much to take  how to take this  when to take this  additional instructions   ibuprofen 600 MG tablet Commonly known as:  ADVIL,MOTRIN Take 1 tablet (600 mg total) by mouth every 6 (six) hours as needed.       Diet: routine diet  Activity: Advance as tolerated. Pelvic rest for 6 weeks.   Outpatient follow up:4 weeks Follow up Appt:No future appointments. Follow up Visit:No follow-ups on  file.  Postpartum contraception: Depo Provera  Newborn Data: Live born female  Birth Weight: 5 lb 15.6 oz (2710 g) APGAR: 2, 4  Newborn Delivery   Birth date/time:  11/24/2017 19:20:00 Delivery type:  Vaginal, Spontaneous     Baby Feeding: Bottle Disposition:NICU   11/26/2017 Serita Grammes, CNM 10:32 AM

## 2017-11-26 NOTE — Clinical Social Work Maternal (Signed)
CLINICAL SOCIAL WORK MATERNAL/CHILD NOTE  Patient Details  Name: ROSELIE CIRIGLIANO MRN: [id number] Date of Birth: [birth year]  Date:  11/26/2017  Clinical Social Worker Initiating Note:  Terri Piedra, [place] Date/Time: Initiated:  11/26/17/1030     Child's Name:  Janett Billow   Biological Parents:  Mother, Father(Tharon Dagley and Memory Argue)   Need for Interpreter:  None   Reason for Referral:  Behavioral Health Concerns, Current Substance Use/Substance Use During Pregnancy    Address:  [place] [address]    Phone number:  [phone] (home)     Additional phone number:   Household Members/Support Persons (HM/SP):   Household Member/Support Person 1, Household Member/Support Person 2   HM/SP Name Relationship DOB or Age  HM/SP -Ramer mother    HM/SP -2   brother 14  HM/SP -3        HM/SP -4        HM/SP -5        HM/SP -6        HM/SP -7        HM/SP -8          Natural Supports (not living in the home):  Immediate Family, Extended Family(MOB replied, "we don't really talk to people."  FOB said, "we have great family support, but we keep it within the family.  Not really friends.")   Professional Supports: None(MOB states she had a Social worker at [facility] in [place] in the past and she can return there in the future if needed.)   Employment:     Type of Work: FOB is a Administrator, Civil Service and a English as a second language teacher.  MOB states plans to return to school to get her GED.   Education:      Homebound arranged:    Financial Resources:  Medicaid   Other Resources:      Cultural/Religious Considerations Which May Impact Care: MOB's facesheet notes religion as Non-Denominational  Strengths:  Ability to meet basic needs , Home prepared for child (Parents report that baby has "more than enough supplies.")   Psychotropic Medications:         Pediatrician:       Pediatrician List:   [facility]      Pediatrician Fax Number:    Risk Factors/Current Problems:  Adjustment to Illness , Mental Health Concerns , Substance Use    Cognitive State:  Alert , Linear Thinking , Able to Concentrate    Mood/Affect:  Calm , Interested    CSW Assessment: CSW met with FOB and MOB in MOB's first floor room/134 to introduce services, offer support and complete assessment due to hx of marijuana use and Anx/Dep.  Parents were pleasant and welcoming and stated they were glad to see someone from NICU because they were eager for an update.  CSW explained CSW's reason for visit as emotional support and inability to provide them with a medical update and asked if we could talk, stating that CSW will ensure that the medical team is aware that they would like to be updated today.  Parents were agreeable.  MOB gave permission to speak openly with FOB present. Parents report that baby is "doing fine," and "won't be here long."  CSW is concerned that they may not understand baby's medical situation.  MOB reports, "her ultrasound waves came back  normal."  FOB added, "she's doing really well and all her tests have come back negative."  MOB told CSW that she is not having any seizures and when asked if she is on seizure medication, MOB states that she has been weaned from two seizure medications to one.  FOB states they were told her situation was caused by lack of oxygen at birth and that they were told she should have been "cut out" hours before she was finally born.  FOB states this "struck me the wrong way."  CSW suggests talking with the OB with any questions or concerns regarding delivery as a way to help process.  Both parents agree that it is behind them, that "it's a blessing she's alive," and that they just want to move forward.  They report no concerns at this time regarding her care in NICU. Parents report that they are in a relationship and that this is the first  child for both of them.  They do not live together, but their homes are not far from each other.  CSW offered gas cards since they will be traveling from [place] to visit baby after MOB's discharge.  They accepted and were appreciative.  CSW has only 1 at this time and gave it to FOB.  FOB states he has taken about a week off of work.  MOB states she does not drive but will not have any issues getting to the hospital as often as she would like to be with baby.  She will come with FOB or her mother.   CSW inquired about marijuana use noted in MOB's chart.  MOB states this was "beginning of my pregnancy."  She denies use now and states "I've been clean."  FOB states MOB won't even take a Tylenol or Ibuprofen.  They were understanding of hospital drug screen policy and mandated reporting for positive screens and state no concerns.  UDS was not collected.   CSW explained ongoing support services offered by CSW and provided contact information, encouraging parents to call any time.  Parents thanked CSW for the visit.    CSW Plan/Description:  No Further Intervention Required/No Barriers to Discharge, Psychosocial Support and Ongoing Assessment of Needs, Sudden Infant Death Syndrome (SIDS) Education, Perinatal Mood and Anxiety Disorder (PMADs) Education, [place], CSW Will Continue to Monitor Umbilical Cord Tissue Drug Screen Results and Make Report if Barnetta Chapel 11/26/2017, 12:27 PM

## 2017-11-28 ENCOUNTER — Telehealth: Payer: Self-pay | Admitting: *Deleted

## 2017-11-28 NOTE — Telephone Encounter (Signed)
LMOM for pt to call us back to set up pp appointment.  11-28-17  AS 

## 2017-11-29 ENCOUNTER — Telehealth: Payer: Self-pay | Admitting: *Deleted

## 2017-11-29 NOTE — Telephone Encounter (Signed)
Left another message for pt to call us back to schedule pp appointment.  11-29-17  AS

## 2017-12-08 IMAGING — CT CT ABD-PELV W/ CM
2 of 4 series · 15 of 46 positions shown, 17 images · IV contrast (Isovue)
Comparison: None.

CLINICAL DATA: Right rib pain and vomiting.

EXAM:
CT ABDOMEN AND PELVIS WITH CONTRAST
TECHNIQUE: Multidetector CT imaging of the abdomen and pelvis was performed
using the standard protocol following bolus administration of
intravenous contrast.
CONTRAST:  100mL [id number] IOPAMIDOL ([id number]-JGG) INJECTION 61%

[Series 2: axial st · axial · 0.69mm/px · z∈[+845,+1275]mm · 12 of 102 slices shown, 14 images]
[im 8/102  soft-tissue]
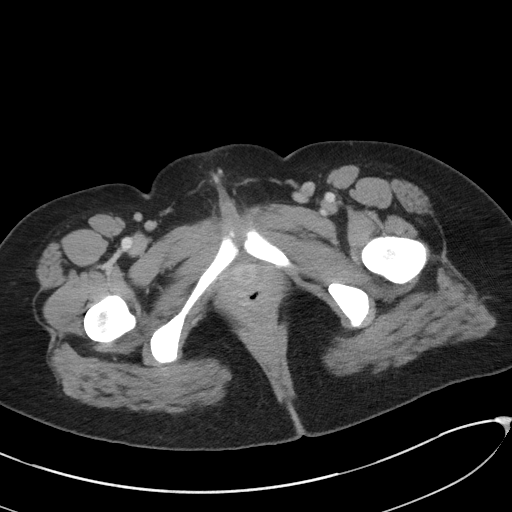
[im 8/102  bone]
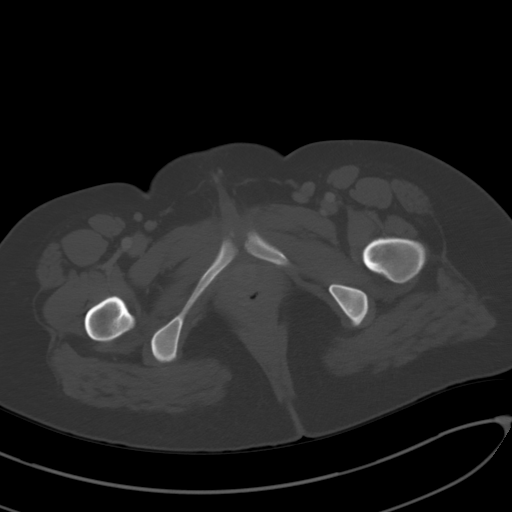
[im 16/102  soft-tissue]
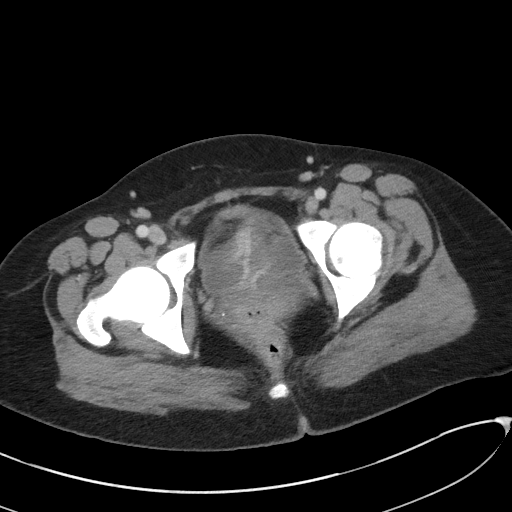
[im 24/102  soft-tissue]
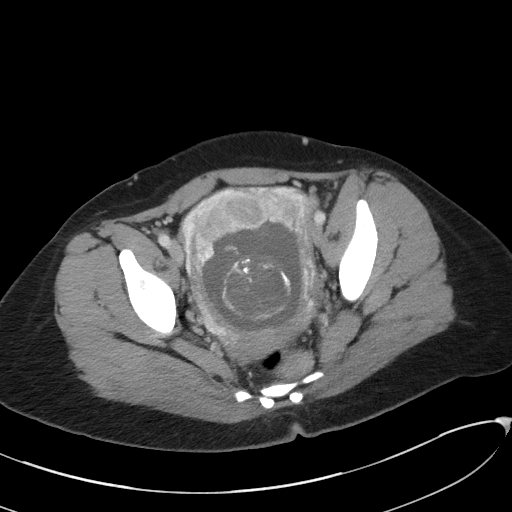
[im 32/102  soft-tissue]
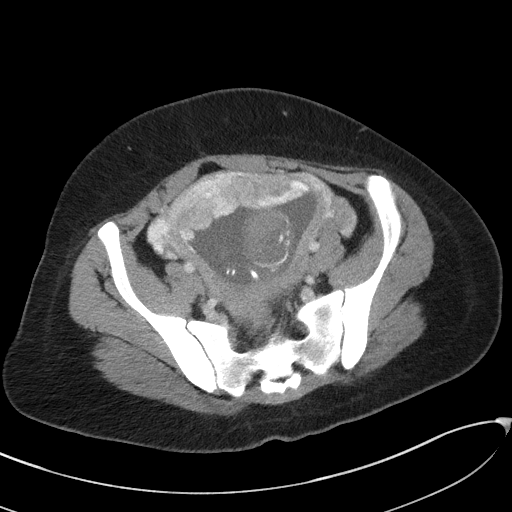
[im 39/102  soft-tissue]
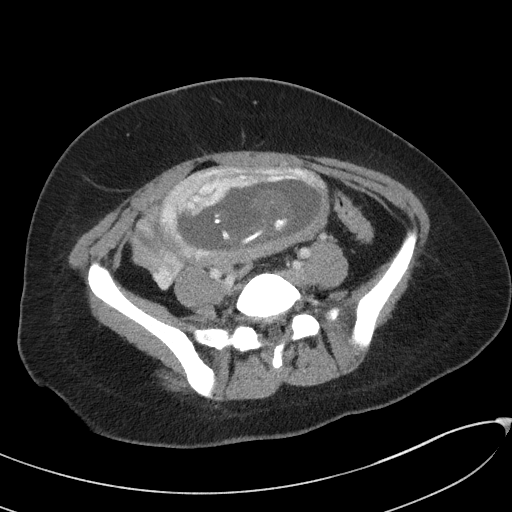
[im 47/102  soft-tissue]
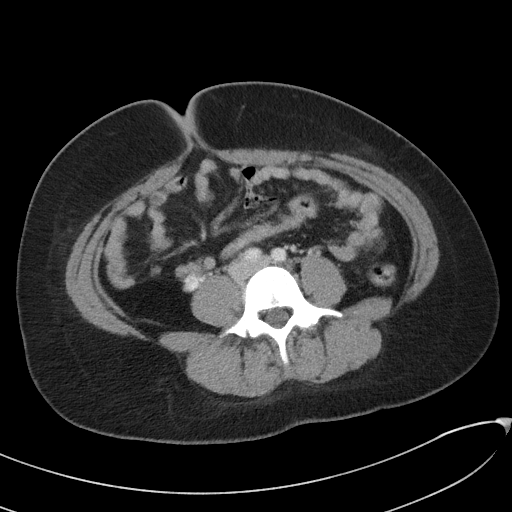
[im 55/102  soft-tissue]
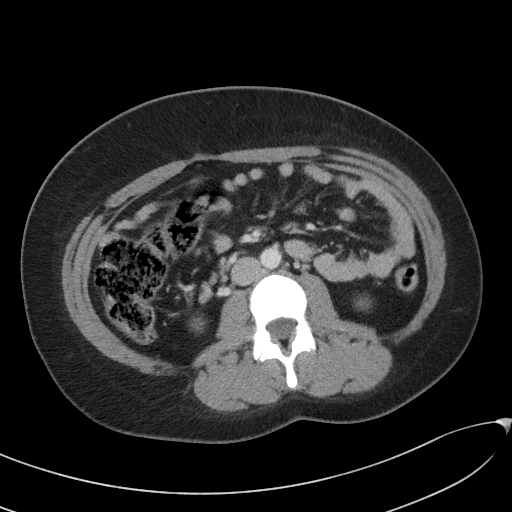
[im 63/102  soft-tissue]
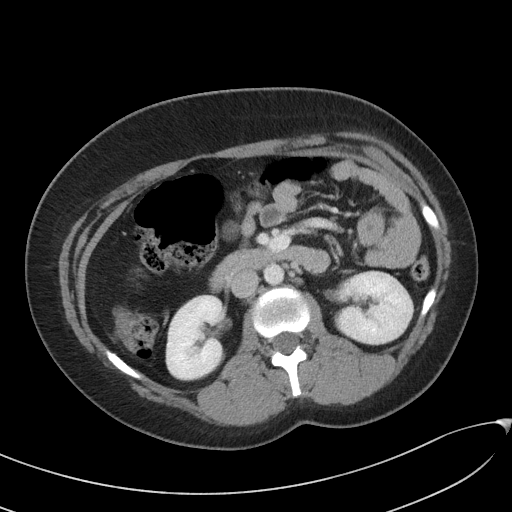
[im 70/102  soft-tissue]
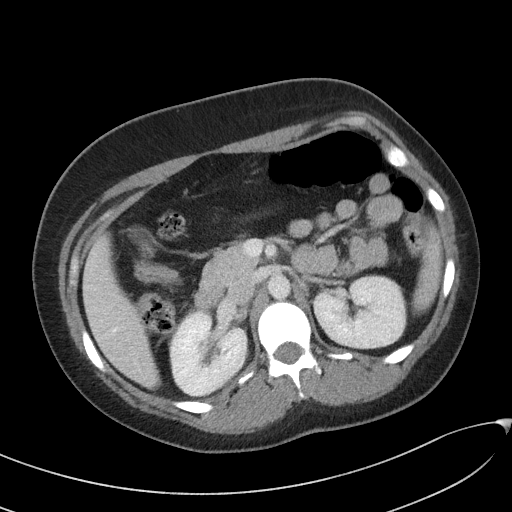
[im 70/102  bone]
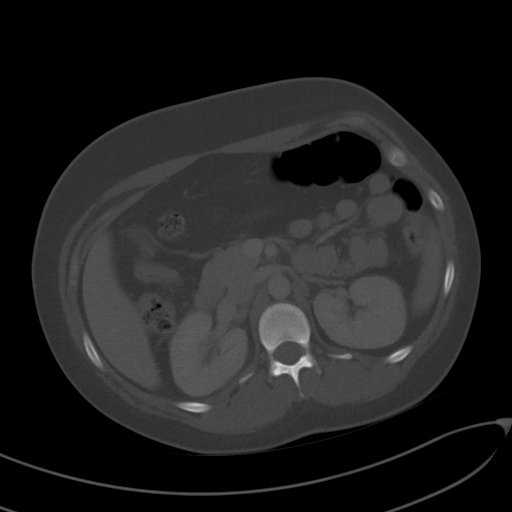
[im 78/102  soft-tissue]
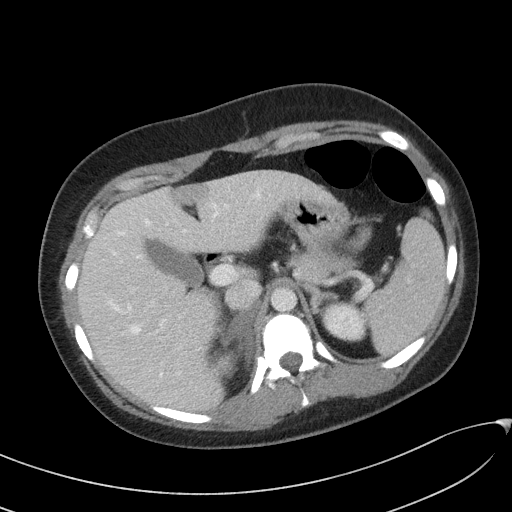
[im 86/102  soft-tissue]
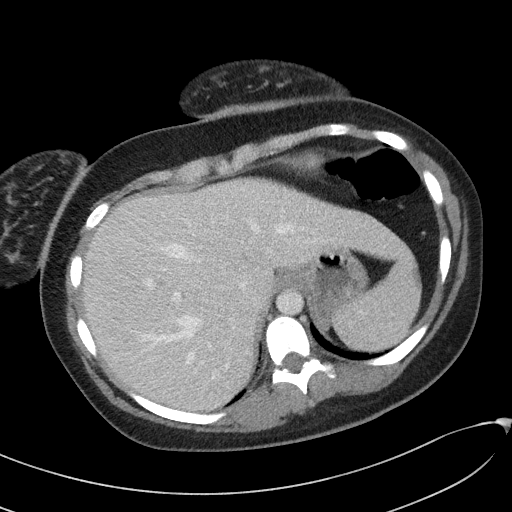
[im 94/102  soft-tissue]
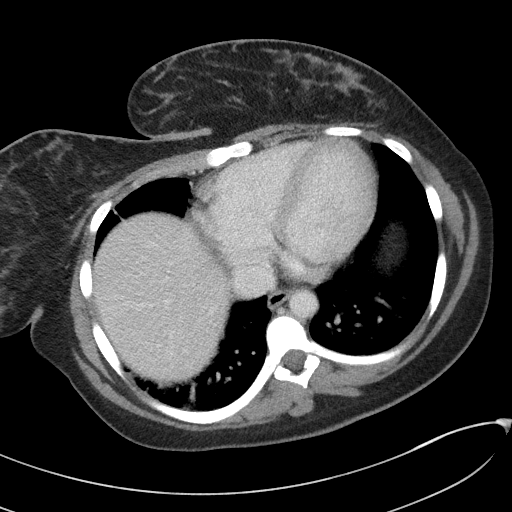

[Series 5: coronal st · coronal · 0.70mm/px · 3 of 81 slices shown]
[im 27/81  soft-tissue]
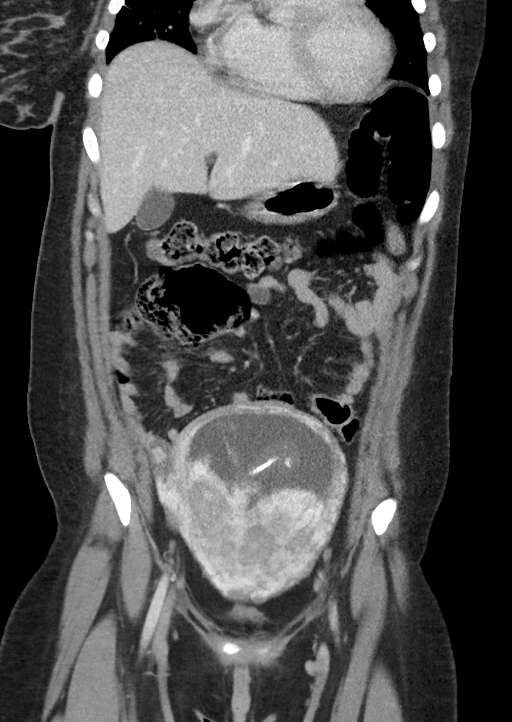
[im 36/81  soft-tissue]
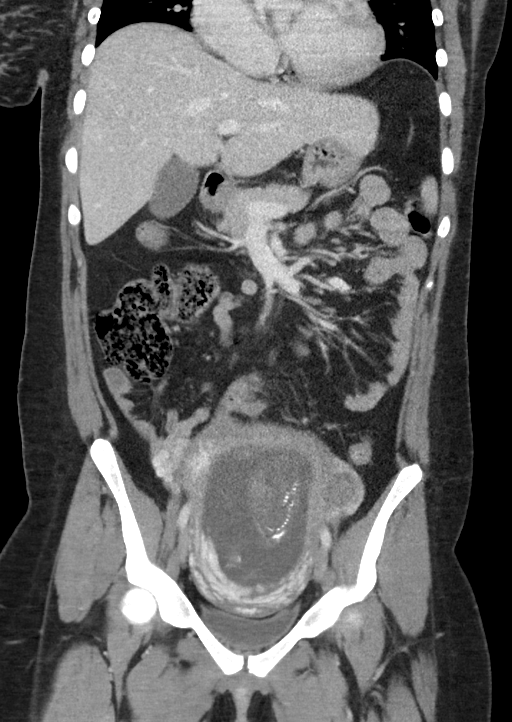
[im 45/81  soft-tissue]
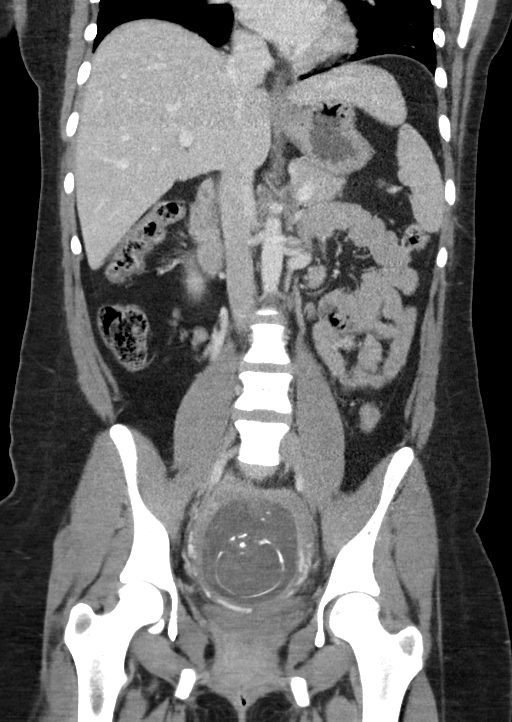

[15 of 46 positions shown; findings below may reference images not displayed]

FINDINGS: Lower chest: Right lower lobe atelectasis.

Hepatobiliary: No focal liver abnormality is seen. No gallstones,
gallbladder wall thickening, or biliary dilatation. Incidental note
of small area of hypoperfusion versus fatty infiltration along the
falciform ligament.

Pancreas: Unremarkable. No pancreatic ductal dilatation or
surrounding inflammatory changes.

Spleen: Normal in size without focal abnormality.

Adrenals/Urinary Tract: The right adrenal gland is uniformly
enlarged, asymmetric from the left, hypoattenuated and with
indistinct margins. There is a small amount of free fluid along the
lateral leaflet of the adrenal gland tracking in the right
retroperitoneal space. The left adrenal gland and bilateral kidneys
are normal.

Stomach/Bowel: Stomach is within normal limits. Appendix appears
normal. No evidence of bowel wall thickening, distention, or
inflammatory changes.

Vascular/Lymphatic: No significant vascular findings are present. No
enlarged abdominal or pelvic lymph nodes.

Reproductive: Gravid uterus.  No adnexal masses seen.

Other: No abdominal wall hernia or abnormality. No abdominopelvic
ascites.

Musculoskeletal: No acute or significant osseous findings.
IMPRESSION: Grossly abnormal appearance of the right adrenal gland, which is
asymmetrically enlarged, hypoattenuated and with indistinct margins,
with small amount of surrounding fluid tracking into the right
retroperitoneal space. Differential diagnosis includes infection of
the right adrenal gland, necrosis due to embolic infarct, or adrenal
hemorrhage.

No rib fractures are seen in the visualized portion of the ribs.

Right lower lobe atelectasis.

## 2017-12-27 ENCOUNTER — Ambulatory Visit: Payer: Self-pay | Admitting: Advanced Practice Midwife

## 2018-01-10 ENCOUNTER — Ambulatory Visit: Payer: Self-pay | Admitting: Advanced Practice Midwife

## 2018-01-12 ENCOUNTER — Ambulatory Visit: Payer: Self-pay | Admitting: Women's Health

## 2018-01-18 ENCOUNTER — Ambulatory Visit (INDEPENDENT_AMBULATORY_CARE_PROVIDER_SITE_OTHER): Payer: Medicaid Other | Admitting: Women's Health

## 2018-01-18 ENCOUNTER — Encounter: Payer: Self-pay | Admitting: Women's Health

## 2018-01-18 DIAGNOSIS — F53 Postpartum depression: Secondary | ICD-10-CM | POA: Diagnosis not present

## 2018-01-18 DIAGNOSIS — Z8759 Personal history of other complications of pregnancy, childbirth and the puerperium: Secondary | ICD-10-CM | POA: Insufficient documentation

## 2018-01-18 DIAGNOSIS — O99345 Other mental disorders complicating the puerperium: Secondary | ICD-10-CM

## 2018-01-18 MED ORDER — SERTRALINE HCL 25 MG PO TABS: 25.0000 mg | ORAL_TABLET | Freq: Every day | ORAL | 6 refills | Status: AC

## 2018-01-18 NOTE — Patient Instructions (Signed)

## 2018-01-18 NOTE — Progress Notes (Signed)
POSTPARTUM VISIT Patient name: Tiffany [name] MRN [id number]  Date of birth: Jul 06, 1996 Chief Complaint:   Postpartum Care (on depo)  History of Present Illness:   Tiffany [name] is a 22 y.o. G48P1011 Caucasian female being seen today for a postpartum visit. She is 7 weeks postpartum following a spontaneous vaginal delivery at 40.6 gestational weeks after IOL for IUGR. Anesthesia: nitrous. I have fully reviewed the prenatal and intrapartum course. 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 Bleeding irregular bleeding. Bowel function is normal. Bladder function is normal.  Contraception method is received depo in hospital prior to d/c on 4/20.  [place] Postpartum Depression Screening: positive. Score 23.  Knows she has PPD, eating well, doesn't find joy in things she used to, sleeping ok. Denies SI/HI/II. Has h/o dep/anx, was on meds and did counseling when she was younger. Declines counseling now, does want to get back on meds.  Last pap <21yo.  Results were n/a .  No LMP recorded.  Baby's course has been complicated by 4wk NICU stay, seizures, low apgars. Doing well, came home 3wks ago, on Keppra. Baby is feeding by bottle.  Review of Systems:   Pertinent items are noted in HPI Denies Abnormal vaginal discharge w/ itching/odor/irritation, headaches, visual changes, shortness of breath, chest pain, abdominal pain, severe nausea/vomiting, or problems with urination or bowel movements. Pertinent History Reviewed:  Reviewed past medical,surgical, obstetrical and family history.  Reviewed problem list, medications and allergies. OB History  Gravida Para Term Preterm AB Living  2 1 1   1 1   SAB TAB Ectopic Multiple Live Births  1     0 1    # Outcome Date GA Lbr Len/2nd Weight Sex Delivery Anes PTL Lv  2 Term 11/24/17 [redacted]w[redacted]d 03:39 / 00:21 5 lb 15.6 oz (2.71 kg) F Vag-Spont Other  LIV  1 SAB 2017           Physical  Assessment:   Vitals:   01/18/18 1612  BP: 95/61  Pulse: 73  Weight: 155 lb (70.3 kg)  Height: 5\' 4"  (1.626 m)  Body mass index is 26.61 kg/m.       Physical Examination:   General appearance: alert, well appearing, and in no distress  Mental status: alert, oriented to person, place, and time  Skin: warm & dry   Cardiovascular: normal heart rate noted   Respiratory: normal respiratory effort, no distress   Breasts: deferred, no complaints   Abdomen: soft, non-tender   Pelvic: exam declined by the patient  Rectal: exam declined  Extremities: no edema       No results found for this or any previous visit (from the past 24 hour(s)).  Assessment & Plan:  1) Postpartum exam 2) 7 wks s/p SVB after IOL for IUGR dx @ 40.4wks 3) Bottlefeeding 4) Depression screening 5) Contraception surveillance> received depo in hospital 4/20, will be due again 7/6-7/20 6) PPD> rx zoloft 25mg , understands can take few weeks to notice improvement, declines counseling, f/u 4wks  Meds:  Meds ordered this encounter  Medications  . sertraline (ZOLOFT) 25 MG tablet    Sig: Take 1 tablet (25 mg total) by mouth daily.    Dispense:  30 tablet    Refill:  6    Order Specific Question:   Supervising Provider    Answer:   Lazaro Arms [[id number]]    Follow-up: Return in about 1 month (around 02/15/2018) for F/U  w/ me.   No orders of the defined types were placed in this encounter.   Cheral MarkerKimberly R Booker CNM, [facility]WHNP-BC 01/18/2018 4:59 PM

## 2018-01-31 ENCOUNTER — Emergency Department (HOSPITAL_COMMUNITY)
Admission: EM | Admit: 2018-01-31 | Discharge: 2018-01-31 | Disposition: A | Discharge status: Home / Self Care | Payer: Medicaid Other | Attending: Emergency Medicine | Admitting: Emergency Medicine

## 2018-01-31 ENCOUNTER — Encounter (HOSPITAL_COMMUNITY): Payer: Self-pay | Admitting: Emergency Medicine

## 2018-01-31 DIAGNOSIS — Z20818 Contact with and (suspected) exposure to other bacterial communicable diseases: Secondary | ICD-10-CM | POA: Diagnosis not present

## 2018-01-31 DIAGNOSIS — Z79899 Other long term (current) drug therapy: Secondary | ICD-10-CM | POA: Diagnosis not present

## 2018-01-31 DIAGNOSIS — R05 Cough: Secondary | ICD-10-CM | POA: Diagnosis present

## 2018-01-31 DIAGNOSIS — Z87891 Personal history of nicotine dependence: Secondary | ICD-10-CM | POA: Diagnosis not present

## 2018-01-31 DIAGNOSIS — J45909 Unspecified asthma, uncomplicated: Secondary | ICD-10-CM | POA: Diagnosis not present

## 2018-01-31 DIAGNOSIS — Z23 Encounter for immunization: Secondary | ICD-10-CM | POA: Insufficient documentation

## 2018-01-31 MED ORDER — AZITHROMYCIN 250 MG PO TABS: 250.0000 mg | ORAL_TABLET | Freq: Every day | ORAL | 0 refills | Status: DC

## 2018-01-31 MED ORDER — TETANUS-DIPHTH-ACELL PERTUSSIS 5-2.5-18.5 LF-MCG/0.5 IM SUSP
0.5000 mL | Freq: Once | INTRAMUSCULAR | Status: AC
  Administered 2018-01-31: 0.5 mL via INTRAMUSCULAR
  Filled 2018-01-31: qty 0.5

## 2018-01-31 NOTE — ED Provider Notes (Signed)
[facility] EMERGENCY DEPARTMENT Provider Note   CSN: [id number] Arrival date & time: 01/31/18  0404     History   Chief Complaint Chief Complaint  Patient presents with  . Cough    HPI MELAINA HOWERTON is a 22 y.o. female.  Patient states she was exposed to pertussis.  Her 3-month-old daughter was diagnosed with pertussis over the weekend patient was told to come to the ED for tetanus vaccine as well as antibiotics.  She denies any symptoms.  No chest pain, shortness of breath, cough or fever.  No abdominal pain, vomiting or diarrhea.  Good p.o. intake and urine output.  The history is provided by the patient.  Cough  Pertinent negatives include no chest pain, no headaches, no rhinorrhea and no myalgias.    Past Medical History:  Diagnosis Date  . Anxiety   . Asthma   . Bronchitis in child   . Depression   . Eczema     Patient Active Problem List   Diagnosis Date Noted  . History of prior pregnancy with IUGR newborn 01/18/2018  . Postpartum depression 01/18/2018  . 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 Gastroesophageal reflux disease without esophagitis 03/07/2017  . Depression with anxiety 03/07/2017  . Intrinsic eczema 03/07/2017    Past Surgical History:  Procedure Laterality Date  . WISDOM TOOTH EXTRACTION       OB History    Gravida  2   Para  1   Term  1   Preterm      AB  1   Living  1     SAB  1   TAB      Ectopic      Multiple  0   Live Births  1            Home Medications    Prior to Admission medications   Medication Sig Start Date End Date Taking? Authorizing Provider  albuterol (PROVENTIL HFA;VENTOLIN HFA) 108 (90 Base) MCG/ACT inhaler Inhale 2 puffs into the lungs every 6 (six) hours as needed for wheezing or shortness of breath.     [provider]  flintstones complete (FLINTSTONES) 60 MG chewable tablet Take 2 daily Patient taking differently: Chew 1 tablet by mouth 2 (two) times daily.  03/28/17    Adline Potter, NP  ibuprofen (ADVIL,MOTRIN) 600 MG tablet Take 1 tablet (600 mg total) by mouth every 6 (six) hours as needed. Patient not taking: Reported on 01/18/2018 11/26/17   Arabella Merles, CNM  sertraline (ZOLOFT) 25 MG tablet Take 1 tablet (25 mg total) by mouth daily. 01/18/18   Cheral Marker, CNM    Family History Family History  Problem Relation Age of Onset  . COPD Mother   . Diabetes Mother        pre-diabetes  . Other Mother        boils  . COPD Paternal Grandfather   . Diabetes Paternal Grandmother   . COPD Maternal Grandmother   . Thyroid disease Maternal Grandmother   . Aneurysm Maternal Grandfather   . Thyroid disease Maternal Aunt     Social History Social History   Tobacco Use  . Smoking status: Former Smoker    Years: 1.00    Types: Cigarettes  . Smokeless tobacco: Never Used  Substance Use Topics  . Alcohol use: No  . Drug use: Not Currently    Types: Marijuana    Comment: last used jan 2019  Allergies   Patient has no known allergies.   Review of Systems Review of Systems  Constitutional: Negative for activity change, appetite change and fever.  HENT: Negative for congestion and rhinorrhea.   Eyes: Negative for visual disturbance.  Respiratory: Positive for cough.   Cardiovascular: Negative for chest pain.  Gastrointestinal: Negative for abdominal pain, nausea and vomiting.  Genitourinary: Negative for dysuria, hematuria, vaginal bleeding and vaginal discharge.  Musculoskeletal: Negative.  Negative for myalgias.  Neurological: Negative for dizziness, weakness and headaches.   all other systems are negative except as noted in the HPI and PMH.    Physical Exam Updated Vital Signs BP 114/90 (BP Location: Left Arm)   Pulse (!) 54   Temp 98 F (36.7 C) (Oral)   Resp 18   Ht 5\' 4"  (1.626 m)   Wt 70.3 kg (155 lb)   SpO2 100%   BMI 26.61 kg/m   Physical Exam  Constitutional: She is oriented to person, place, and time.  She appears well-developed and well-nourished. No distress.  HENT:  Head: Normocephalic and atraumatic.  Mouth/Throat: Oropharynx is clear and moist. No oropharyngeal exudate.  Eyes: Pupils are equal, round, and reactive to light. Conjunctivae and EOM are normal.  Neck: Normal range of motion. Neck supple.  No meningismus.  Cardiovascular: Normal rate, regular rhythm, normal heart sounds and intact distal pulses.  No murmur heard. Pulmonary/Chest: Effort normal and breath sounds normal. No respiratory distress.  Abdominal: Soft. There is no tenderness. There is no rebound and no guarding.  Musculoskeletal: Normal range of motion. She exhibits no edema or tenderness.  Neurological: She is alert and oriented to person, place, and time. No cranial nerve deficit. She exhibits normal muscle tone. Coordination normal.   5/5 strength throughout. CN 2-12 intact.Equal grip strength.   Skin: Skin is warm. Rash noted.  Eczema lesions to upper extremities  Psychiatric: She has a normal mood and affect. Her behavior is normal.  Nursing note and vitals reviewed.    ED Treatments / Results  Labs (all labs ordered are listed, but only abnormal results are displayed) Labs Reviewed - No data to display  EKG None  Radiology No results found.  Procedures Procedures (including critical care time)  Medications Ordered in ED Medications  Tdap (BOOSTRIX) injection 0.5 mL (has no administration in time range)     Initial Impression / Assessment and Plan / ED Course  I have reviewed the triage vital signs and the nursing notes.  Pertinent labs & imaging results that were available during my care of the patient were reviewed by me and considered in my medical decision making (see chart for details).    Patient with exposure to pertussis.  No symptoms.  No chest pain or shortness of breath.  No hypoxia or tachycardia.  Lungs are clear to auscultation  We will update tetanus and provide course of  Zithromax.  Follow-up with health department.  Return precautions discussed.  Final Clinical Impressions(s) / ED Diagnoses   Final diagnoses:  Pertussis exposure    ED Discharge Orders    None       Jarrod Bodkins, Jeannett SeniorStephen, MD 01/31/18 [phone]

## 2018-01-31 NOTE — Discharge Instructions (Addendum)
Take the antibiotics as prescribed and followup with your doctor. Return to the ED if you develop new or worsening symptoms. °

## 2018-01-31 NOTE — ED Triage Notes (Signed)
Pt states she is being seen here per Health Department needs to be given a tetanus shot and antibiotic d/t being exposed to Whooping cough by infant daughter.

## 2018-02-13 ENCOUNTER — Telehealth: Payer: Self-pay | Admitting: Women's Health

## 2018-02-13 NOTE — Telephone Encounter (Signed)
Patient called stating that she needs a refill of her depo, pt states that she is due soon. Please contact pt

## 2018-02-14 MED ORDER — MEDROXYPROGESTERONE ACETATE 150 MG/ML IM SUSP: 150.0000 mg | INTRAMUSCULAR | 3 refills | Status: DC

## 2018-02-14 NOTE — Telephone Encounter (Signed)
Informed patient depo was sent to pharmacy and must bring with her to appt. Verbalized understanding.

## 2018-02-15 ENCOUNTER — Other Ambulatory Visit: Payer: Self-pay

## 2018-02-15 ENCOUNTER — Ambulatory Visit: Payer: Self-pay

## 2018-02-15 ENCOUNTER — Ambulatory Visit (INDEPENDENT_AMBULATORY_CARE_PROVIDER_SITE_OTHER): Payer: Medicaid Other | Admitting: *Deleted

## 2018-02-15 ENCOUNTER — Ambulatory Visit: Payer: Self-pay | Admitting: Women's Health

## 2018-02-15 ENCOUNTER — Encounter: Payer: Self-pay | Admitting: *Deleted

## 2018-02-15 DIAGNOSIS — Z3202 Encounter for pregnancy test, result negative: Secondary | ICD-10-CM

## 2018-02-15 DIAGNOSIS — Z3042 Encounter for surveillance of injectable contraceptive: Secondary | ICD-10-CM

## 2018-02-15 LAB — POCT URINE PREGNANCY: PREG TEST UR: NEGATIVE

## 2018-02-15 MED ORDER — MEDROXYPROGESTERONE ACETATE 150 MG/ML IM SUSP
150.0000 mg | Freq: Once | INTRAMUSCULAR | Status: AC
  Administered 2018-02-15: 150 mg via INTRAMUSCULAR

## 2018-02-15 NOTE — Progress Notes (Signed)
Pt given DepoProvera 150mg IM left deltoid without complications. Advised to return in 12 weeks for next injection. 

## 2018-02-27 ENCOUNTER — Ambulatory Visit: Payer: Self-pay | Admitting: Women's Health

## 2018-03-06 ENCOUNTER — Ambulatory Visit: Payer: Self-pay | Admitting: Women's Health

## 2018-03-21 ENCOUNTER — Ambulatory Visit: Payer: Self-pay | Admitting: Advanced Practice Midwife

## 2018-03-22 ENCOUNTER — Ambulatory Visit: Payer: Medicaid Other | Admitting: Advanced Practice Midwife

## 2018-03-27 ENCOUNTER — Ambulatory Visit: Payer: Medicaid Other | Admitting: Women's Health

## 2018-04-03 ENCOUNTER — Ambulatory Visit: Payer: Medicaid Other | Admitting: Women's Health

## 2018-05-03 ENCOUNTER — Ambulatory Visit: Payer: Self-pay

## 2018-05-08 ENCOUNTER — Ambulatory Visit: Payer: Self-pay

## 2018-05-10 ENCOUNTER — Ambulatory Visit: Payer: Self-pay

## 2018-05-10 ENCOUNTER — Ambulatory Visit (INDEPENDENT_AMBULATORY_CARE_PROVIDER_SITE_OTHER): Payer: Medicaid Other

## 2018-05-10 VITALS — Ht 62.2 in | Wt 163.0 lb

## 2018-05-10 DIAGNOSIS — Z3202 Encounter for pregnancy test, result negative: Secondary | ICD-10-CM | POA: Diagnosis not present

## 2018-05-10 DIAGNOSIS — Z3042 Encounter for surveillance of injectable contraceptive: Secondary | ICD-10-CM | POA: Diagnosis not present

## 2018-05-10 LAB — POCT URINE PREGNANCY: Preg Test, Ur: NEGATIVE

## 2018-05-10 MED ORDER — MEDROXYPROGESTERONE ACETATE 150 MG/ML IM SUSP
150.0000 mg | Freq: Once | INTRAMUSCULAR | Status: AC
  Administered 2018-05-10: 150 mg via INTRAMUSCULAR

## 2018-05-10 NOTE — Progress Notes (Signed)
Pt here for depo injection 150 mg IM given rt deltoid. Tolerated well. Return 12 weeks for next injection. Pad CMA 

## 2018-08-03 ENCOUNTER — Ambulatory Visit: Payer: Self-pay

## 2018-08-07 ENCOUNTER — Ambulatory Visit (INDEPENDENT_AMBULATORY_CARE_PROVIDER_SITE_OTHER): Payer: Medicaid Other

## 2018-08-07 VITALS — Ht 62.2 in | Wt 167.8 lb

## 2018-08-07 DIAGNOSIS — Z3202 Encounter for pregnancy test, result negative: Secondary | ICD-10-CM

## 2018-08-07 DIAGNOSIS — Z3042 Encounter for surveillance of injectable contraceptive: Secondary | ICD-10-CM

## 2018-08-07 LAB — POCT URINE PREGNANCY: PREG TEST UR: NEGATIVE

## 2018-08-07 MED ORDER — MEDROXYPROGESTERONE ACETATE 150 MG/ML IM SUSP
150.0000 mg | Freq: Once | INTRAMUSCULAR | Status: AC
  Administered 2018-08-07: 150 mg via INTRAMUSCULAR

## 2018-08-07 NOTE — Progress Notes (Signed)
Pt here for depo injection 150 mg IM given lt deltoid. Tolerated well.Return 12 weeks for next injection. Pad CMA 

## 2018-10-11 NOTE — Telephone Encounter (Signed)
Note sent to nurse. 

## 2018-10-30 ENCOUNTER — Ambulatory Visit: Payer: Self-pay

## 2018-10-30 ENCOUNTER — Telehealth: Payer: Self-pay | Admitting: Obstetrics & Gynecology

## 2018-10-30 ENCOUNTER — Telehealth: Payer: Self-pay | Admitting: *Deleted

## 2018-10-30 MED ORDER — MEDROXYPROGESTERONE ACETATE 150 MG/ML IM SUSP: 150.0000 mg | INTRAMUSCULAR | 3 refills | Status: AC

## 2018-10-30 NOTE — Telephone Encounter (Signed)
Meds ordered this encounter  Medications  . medroxyPROGESTERone (DEPO-PROVERA) 150 MG/ML injection    Sig: Inject 1 mL (150 mg total) into the muscle every 3 (three) months.    Dispense:  1 mL    Refill:  3

## 2018-10-30 NOTE — Telephone Encounter (Signed)
Patient had an appointment for today for a depo, rescheduled for tomorrow and need it called in to pick up.  [employer]  [phone]

## 2018-10-30 NOTE — Telephone Encounter (Signed)
Attempted to notify pt that refill on depo had been sent in, line busy

## 2018-10-30 NOTE — Telephone Encounter (Signed)
done

## 2018-10-31 ENCOUNTER — Ambulatory Visit: Payer: Self-pay

## 2019-08-31 IMAGING — US US OB COMP +14 WK
1 series · 14 of 28 positions shown · non-contrast
Comparison: none

CLINICAL DATA: Pelvic pain.

EXAM:
LIMITED OBSTETRIC ULTRASOUND AN DOPPLER

[Series 1: us ob comp +14 wk · 0.28mm/px · 14 of 33 slices shown]
[im 2/33]
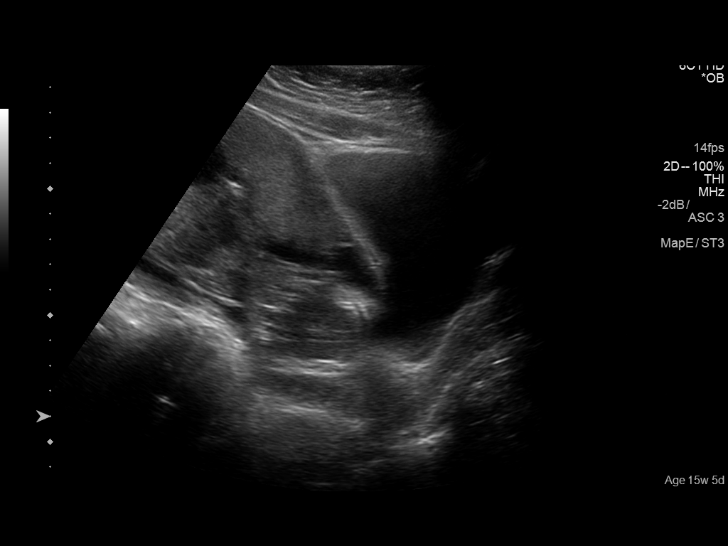
[im 4/33]
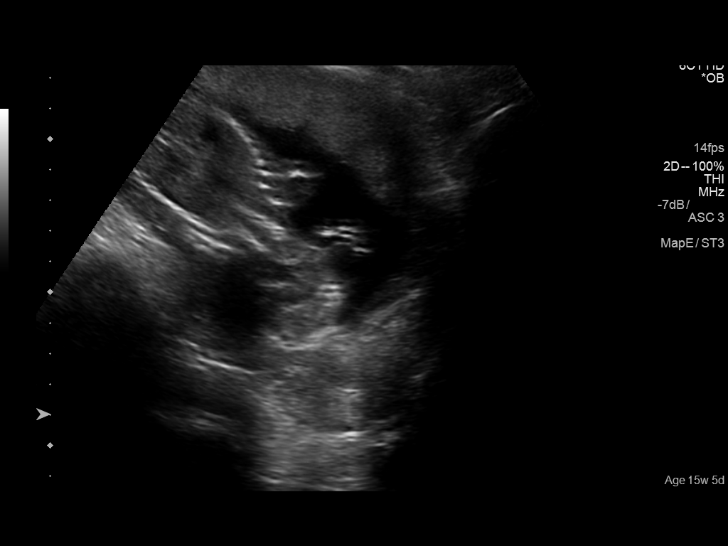
[im 6/33]
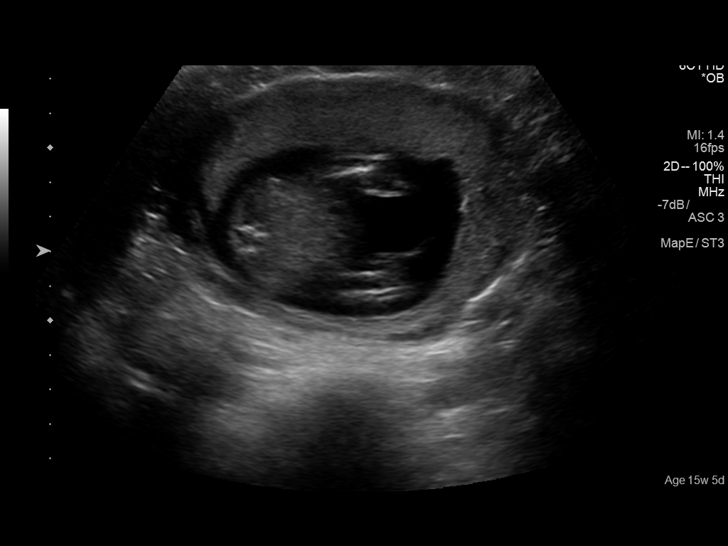
[im 9/33]
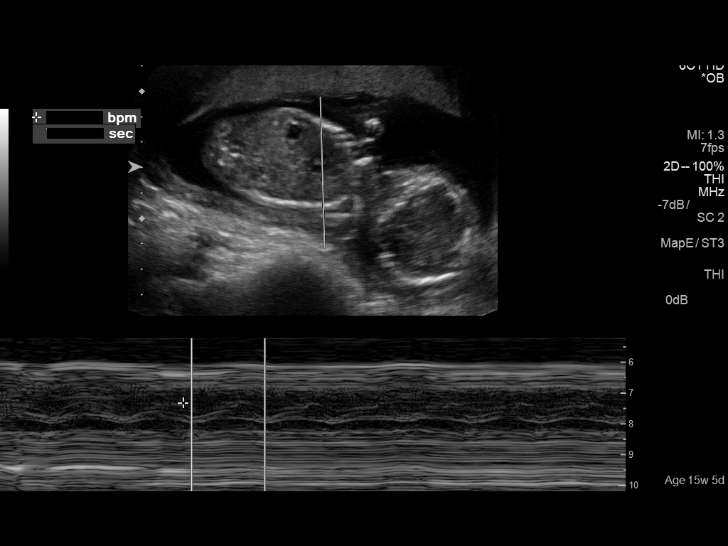
[im 11/33]
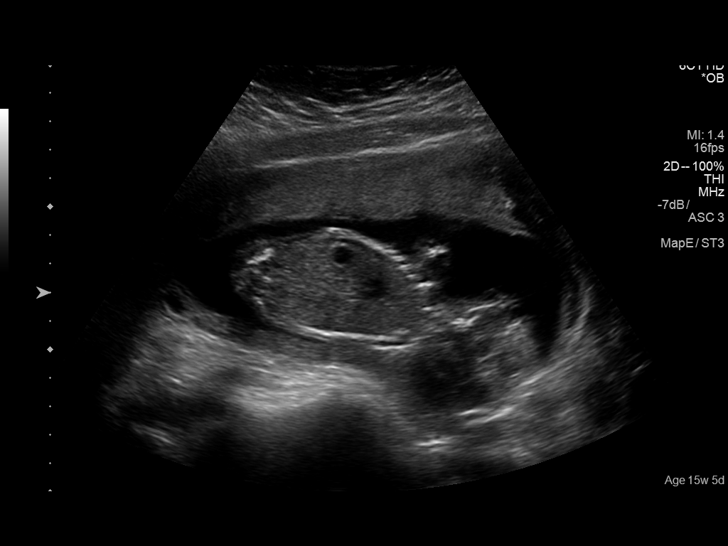
[im 14/33]
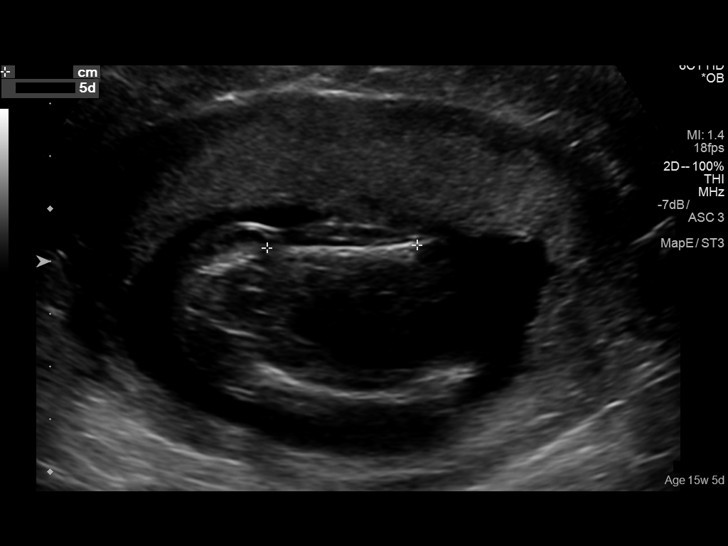
[im 16/33]
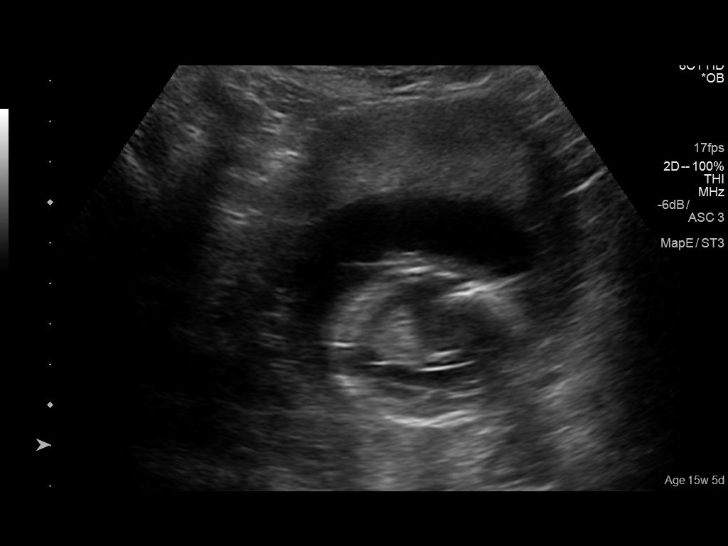
[im 18/33]
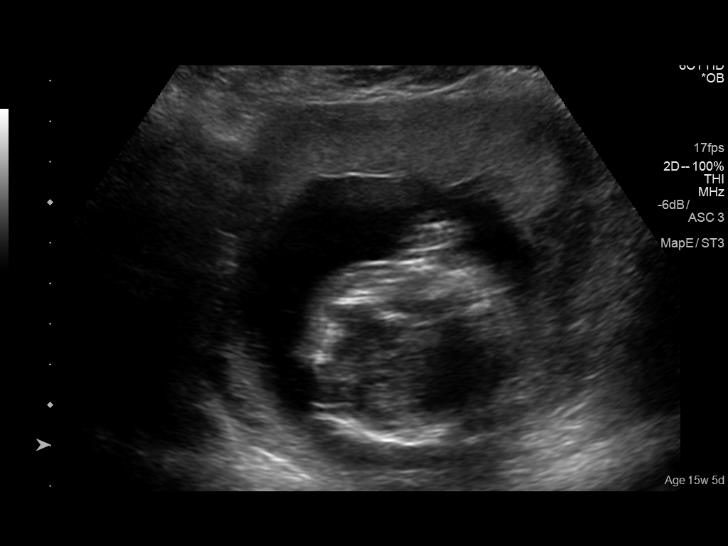
[im 21/33]
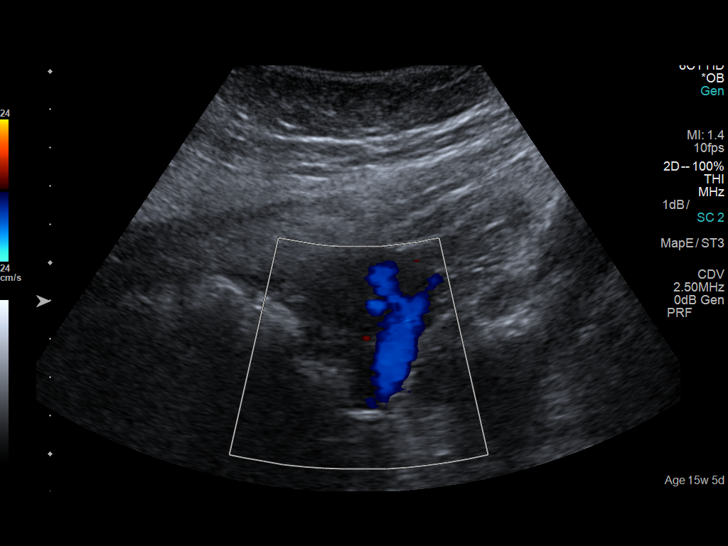
[im 23/33]
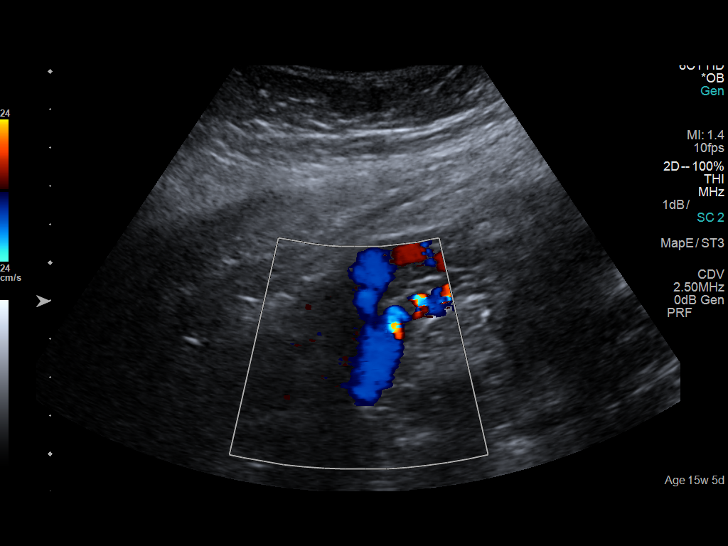
[im 25/33]
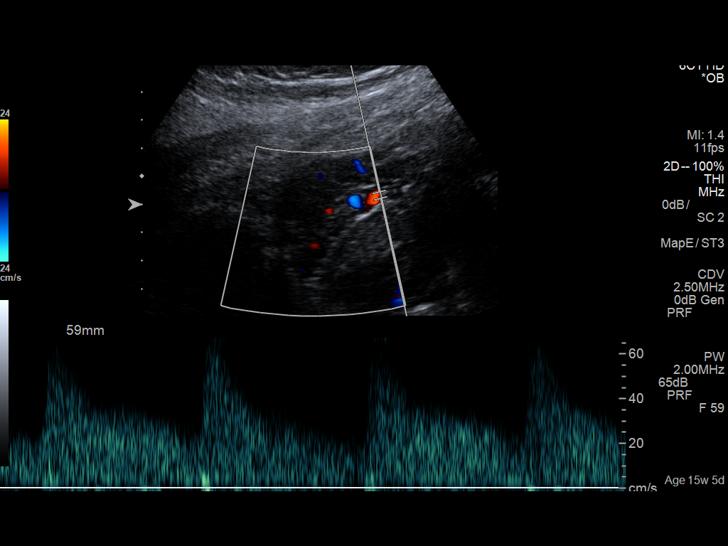
[im 28/33]
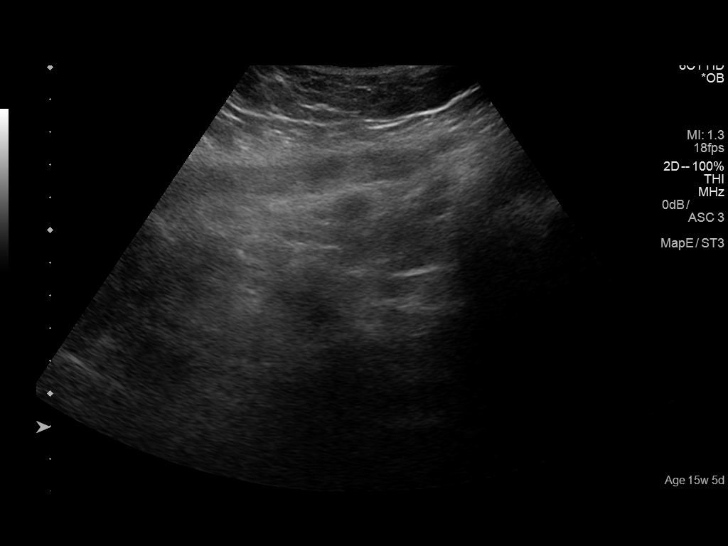
[im 30/33]
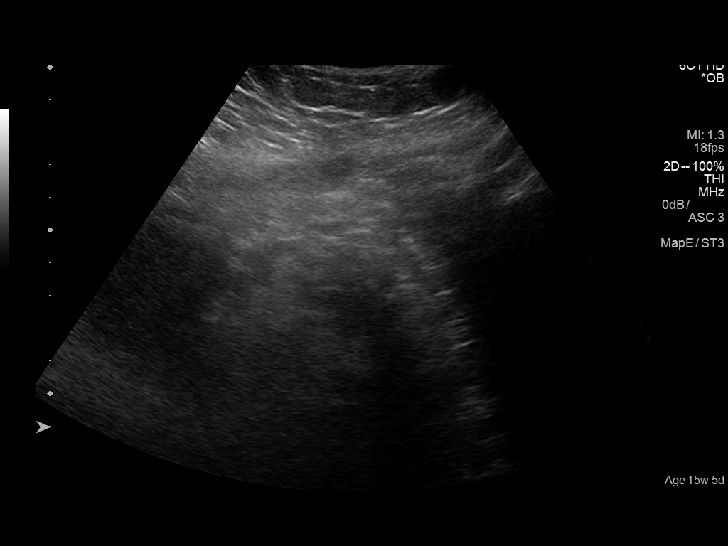
[im 33/33]
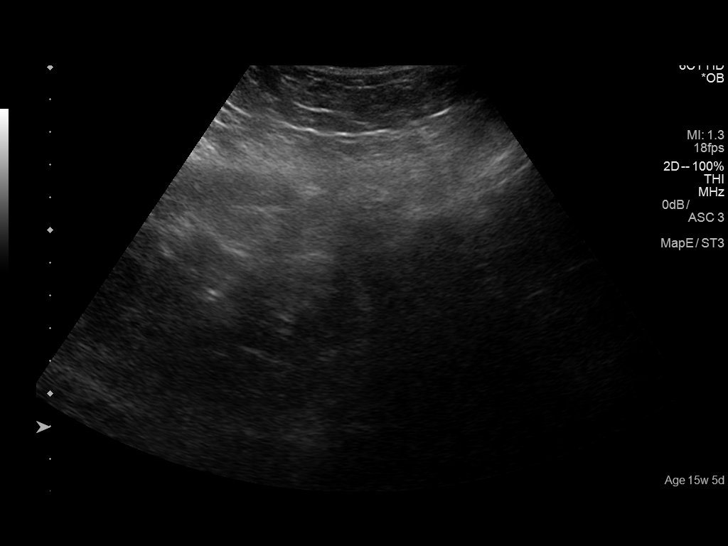

[14 of 28 positions shown; findings below may reference images not displayed]

FINDINGS: Number of Fetuses: 1

Heart Rate:  148 bpm

Movement: Yes

Presentation: Cephalic

Placental Location: Anterior

Previa: No

Amniotic Fluid (Subjective):  Within normal limits.

BPD:  2.87cm 18w  6d

MATERNAL FINDINGS:

Cervix:  Appears closed.

Uterus/Adnexae: Normal right ovary. Right ovary measures 4.4 x 3.7 x
3 cm. No right adnexal mass. Left ovary is not visualize. No left
adnexal mass is identified.

Pulsed Doppler evaluation of right ovary demonstrates normal
low-resistance arterial and venous waveforms.

Other findings

No free fluid.
IMPRESSION: 1. Single live intrauterine pregnancy as detailed above.
2. Normal right ovary.  No right ovarian torsion.
3. Left ovary is not visualized.  No left adnexal mass.

This exam is performed on an emergent basis and does not
comprehensively evaluate fetal size, dating, or anatomy; follow-up
complete OB US should be considered if further fetal assessment is
warranted.
